# Patient Record
Sex: Male | Born: 1965 | Race: Black or African American | Hispanic: No | Smoking: Never smoker
Health system: Southern US, Community
[De-identification: ages and names within clinical notes are randomized; demographics above are authoritative.]

## PROBLEM LIST (undated history)

## (undated) DIAGNOSIS — I639 Cerebral infarction, unspecified: Secondary | ICD-10-CM

## (undated) DIAGNOSIS — Z8679 Personal history of other diseases of the circulatory system: Secondary | ICD-10-CM

## (undated) DIAGNOSIS — E785 Hyperlipidemia, unspecified: Secondary | ICD-10-CM

## (undated) HISTORY — DX: Cerebral infarction, unspecified: I63.9

---

## 2017-09-25 ENCOUNTER — Inpatient Hospital Stay (HOSPITAL_COMMUNITY): Payer: Self-pay

## 2017-09-25 ENCOUNTER — Emergency Department (HOSPITAL_COMMUNITY): Payer: Commercial Managed Care - PPO

## 2017-09-25 ENCOUNTER — Emergency Department (HOSPITAL_COMMUNITY): Payer: Commercial Managed Care - PPO | Admitting: Critical Care Medicine

## 2017-09-25 ENCOUNTER — Encounter (HOSPITAL_COMMUNITY)
Admission: EM | Disposition: A | Discharge status: Home / Self Care | Payer: Self-pay | Source: Home / Self Care | Attending: Neurology

## 2017-09-25 ENCOUNTER — Inpatient Hospital Stay (HOSPITAL_COMMUNITY)
Admission: EM | Admit: 2017-09-25 | Discharge: 2017-09-27 | DRG: 023 | Disposition: A | Discharge status: Home / Self Care | Payer: Commercial Managed Care - PPO | Attending: Neurology | Admitting: Neurology

## 2017-09-25 ENCOUNTER — Inpatient Hospital Stay (HOSPITAL_COMMUNITY): Payer: Commercial Managed Care - PPO

## 2017-09-25 DIAGNOSIS — R402252 Coma scale, best verbal response, oriented, at arrival to emergency department: Secondary | ICD-10-CM | POA: Diagnosis present

## 2017-09-25 DIAGNOSIS — Z888 Allergy status to other drugs, medicaments and biological substances status: Secondary | ICD-10-CM | POA: Diagnosis not present

## 2017-09-25 DIAGNOSIS — R402362 Coma scale, best motor response, obeys commands, at arrival to emergency department: Secondary | ICD-10-CM | POA: Diagnosis present

## 2017-09-25 DIAGNOSIS — R29720 NIHSS score 20: Secondary | ICD-10-CM | POA: Diagnosis present

## 2017-09-25 DIAGNOSIS — Z23 Encounter for immunization: Secondary | ICD-10-CM | POA: Diagnosis not present

## 2017-09-25 DIAGNOSIS — Z881 Allergy status to other antibiotic agents status: Secondary | ICD-10-CM

## 2017-09-25 DIAGNOSIS — S098XXA Other specified injuries of head, initial encounter: Secondary | ICD-10-CM | POA: Diagnosis present

## 2017-09-25 DIAGNOSIS — I63131 Cerebral infarction due to embolism of right carotid artery: Secondary | ICD-10-CM | POA: Diagnosis not present

## 2017-09-25 DIAGNOSIS — I63421 Cerebral infarction due to embolism of right anterior cerebral artery: Secondary | ICD-10-CM | POA: Diagnosis present

## 2017-09-25 DIAGNOSIS — I63311 Cerebral infarction due to thrombosis of right middle cerebral artery: Secondary | ICD-10-CM | POA: Diagnosis not present

## 2017-09-25 DIAGNOSIS — I5030 Unspecified diastolic (congestive) heart failure: Secondary | ICD-10-CM | POA: Diagnosis not present

## 2017-09-25 DIAGNOSIS — E785 Hyperlipidemia, unspecified: Secondary | ICD-10-CM | POA: Diagnosis present

## 2017-09-25 DIAGNOSIS — R471 Dysarthria and anarthria: Secondary | ICD-10-CM | POA: Diagnosis present

## 2017-09-25 DIAGNOSIS — I639 Cerebral infarction, unspecified: Secondary | ICD-10-CM

## 2017-09-25 DIAGNOSIS — H518 Other specified disorders of binocular movement: Secondary | ICD-10-CM | POA: Diagnosis present

## 2017-09-25 DIAGNOSIS — H5347 Heteronymous bilateral field defects: Secondary | ICD-10-CM | POA: Diagnosis present

## 2017-09-25 DIAGNOSIS — Z91041 Radiographic dye allergy status: Secondary | ICD-10-CM

## 2017-09-25 DIAGNOSIS — I63411 Cerebral infarction due to embolism of right middle cerebral artery: Secondary | ICD-10-CM | POA: Diagnosis present

## 2017-09-25 DIAGNOSIS — R402142 Coma scale, eyes open, spontaneous, at arrival to emergency department: Secondary | ICD-10-CM | POA: Diagnosis present

## 2017-09-25 DIAGNOSIS — R29810 Facial weakness: Secondary | ICD-10-CM | POA: Diagnosis present

## 2017-09-25 DIAGNOSIS — W228XXA Striking against or struck by other objects, initial encounter: Secondary | ICD-10-CM | POA: Diagnosis present

## 2017-09-25 DIAGNOSIS — I7771 Dissection of carotid artery: Secondary | ICD-10-CM | POA: Diagnosis present

## 2017-09-25 DIAGNOSIS — Y93H3 Activity, building and construction: Secondary | ICD-10-CM

## 2017-09-25 DIAGNOSIS — Y9289 Other specified places as the place of occurrence of the external cause: Secondary | ICD-10-CM

## 2017-09-25 DIAGNOSIS — Z8679 Personal history of other diseases of the circulatory system: Secondary | ICD-10-CM

## 2017-09-25 DIAGNOSIS — Y990 Civilian activity done for income or pay: Secondary | ICD-10-CM

## 2017-09-25 DIAGNOSIS — R531 Weakness: Secondary | ICD-10-CM | POA: Diagnosis present

## 2017-09-25 DIAGNOSIS — G8194 Hemiplegia, unspecified affecting left nondominant side: Secondary | ICD-10-CM | POA: Diagnosis present

## 2017-09-25 DIAGNOSIS — I6601 Occlusion and stenosis of right middle cerebral artery: Secondary | ICD-10-CM | POA: Diagnosis present

## 2017-09-25 DIAGNOSIS — I63511 Cerebral infarction due to unspecified occlusion or stenosis of right middle cerebral artery: Secondary | ICD-10-CM | POA: Diagnosis not present

## 2017-09-25 HISTORY — DX: Cerebral infarction, unspecified: I63.9

## 2017-09-25 HISTORY — PX: IR CT HEAD LTD: IMG2386

## 2017-09-25 HISTORY — DX: Personal history of other diseases of the circulatory system: Z86.79

## 2017-09-25 HISTORY — PX: RADIOLOGY WITH ANESTHESIA: SHX6223

## 2017-09-25 HISTORY — PX: IR PERCUTANEOUS ART THROMBECTOMY/INFUSION INTRACRANIAL INC DIAG ANGIO: IMG6087

## 2017-09-25 LAB — I-STAT CHEM 8, ED
BUN: 23 mg/dL — ABNORMAL HIGH (ref 6–20)
CREATININE: 1.3 mg/dL — AB (ref 0.61–1.24)
Calcium, Ion: 1.19 mmol/L (ref 1.15–1.40)
Chloride: 105 mmol/L (ref 98–111)
Glucose, Bld: 106 mg/dL — ABNORMAL HIGH (ref 70–99)
HEMATOCRIT: 44 % (ref 39.0–52.0)
HEMOGLOBIN: 15 g/dL (ref 13.0–17.0)
Potassium: 4 mmol/L (ref 3.5–5.1)
Sodium: 140 mmol/L (ref 135–145)
TCO2: 30 mmol/L (ref 22–32)

## 2017-09-25 LAB — RAPID URINE DRUG SCREEN, HOSP PERFORMED
AMPHETAMINES: NOT DETECTED
BENZODIAZEPINES: NOT DETECTED
Barbiturates: NOT DETECTED
COCAINE: NOT DETECTED
Opiates: NOT DETECTED
Tetrahydrocannabinol: NOT DETECTED

## 2017-09-25 LAB — CBC
HCT: 44.7 % (ref 39.0–52.0)
HEMOGLOBIN: 14 g/dL (ref 13.0–17.0)
MCH: 27.7 pg (ref 26.0–34.0)
MCHC: 31.3 g/dL (ref 30.0–36.0)
MCV: 88.5 fL (ref 78.0–100.0)
Platelets: 193 10*3/uL (ref 150–400)
RBC: 5.05 MIL/uL (ref 4.22–5.81)
RDW: 12.3 % (ref 11.5–15.5)
WBC: 4.6 10*3/uL (ref 4.0–10.5)

## 2017-09-25 LAB — CBG MONITORING, ED: Glucose-Capillary: 98 mg/dL (ref 70–99)

## 2017-09-25 LAB — URINALYSIS, ROUTINE W REFLEX MICROSCOPIC
BILIRUBIN URINE: NEGATIVE
GLUCOSE, UA: NEGATIVE mg/dL
HGB URINE DIPSTICK: NEGATIVE
Ketones, ur: NEGATIVE mg/dL
Leukocytes, UA: NEGATIVE
Nitrite: NEGATIVE
Protein, ur: NEGATIVE mg/dL
SPECIFIC GRAVITY, URINE: 1.012 (ref 1.005–1.030)
pH: 5 (ref 5.0–8.0)

## 2017-09-25 LAB — COMPREHENSIVE METABOLIC PANEL
ALT: 22 U/L (ref 0–44)
AST: 29 U/L (ref 15–41)
Albumin: 3.5 g/dL (ref 3.5–5.0)
Alkaline Phosphatase: 42 U/L (ref 38–126)
Anion gap: 9 (ref 5–15)
BUN: 16 mg/dL (ref 6–20)
CHLORIDE: 104 mmol/L (ref 98–111)
CO2: 27 mmol/L (ref 22–32)
CREATININE: 1.28 mg/dL — AB (ref 0.61–1.24)
Calcium: 9.3 mg/dL (ref 8.9–10.3)
GFR calc Af Amer: >60 mL/min (ref >60)
Glucose, Bld: 109 mg/dL — ABNORMAL HIGH (ref 70–99)
POTASSIUM: 4.4 mmol/L (ref 3.5–5.1)
SODIUM: 140 mmol/L (ref 135–145)
Total Bilirubin: 0.8 mg/dL (ref 0.3–1.2)
Total Protein: 6.5 g/dL (ref 6.5–8.1)

## 2017-09-25 LAB — DIFFERENTIAL
ABS IMMATURE GRANULOCYTES: 0 10*3/uL (ref 0.0–0.1)
BASOS PCT: 1 %
Basophils Absolute: 0.1 10*3/uL (ref 0.0–0.1)
EOS ABS: 0.6 10*3/uL (ref 0.0–0.7)
Eosinophils Relative: 13 %
IMMATURE GRANULOCYTES: 1 %
Lymphocytes Relative: 39 %
Lymphs Abs: 1.8 10*3/uL (ref 0.7–4.0)
MONOS PCT: 10 %
Monocytes Absolute: 0.5 10*3/uL (ref 0.1–1.0)
NEUTROS ABS: 1.6 10*3/uL — AB (ref 1.7–7.7)
Neutrophils Relative %: 36 %

## 2017-09-25 LAB — PROTIME-INR
INR: 0.97
Prothrombin Time: 12.8 seconds (ref 11.4–15.2)

## 2017-09-25 LAB — APTT: aPTT: 25 seconds (ref 24–36)

## 2017-09-25 LAB — I-STAT TROPONIN, ED: TROPONIN I, POC: 0.01 ng/mL (ref 0.00–0.08)

## 2017-09-25 LAB — ECHOCARDIOGRAM COMPLETE
Height: 69 in
Weight: 2289.26 oz

## 2017-09-25 LAB — ETHANOL: Alcohol, Ethyl (B): <10 mg/dL (ref <10)

## 2017-09-25 SURGERY — IR WITH ANESTHESIA
Anesthesia: Choice

## 2017-09-25 MED ORDER — SENNOSIDES-DOCUSATE SODIUM 8.6-50 MG PO TABS: 1.0000 | ORAL_TABLET | Freq: Every evening | ORAL | Status: DC | PRN

## 2017-09-25 MED ORDER — TIROFIBAN HCL IN NACL 5-0.9 MG/100ML-% IV SOLN
INTRAVENOUS | Status: AC
  Filled 2017-09-25: qty 100

## 2017-09-25 MED ORDER — ACETAMINOPHEN 160 MG/5ML PO SOLN: 650.0000 mg | ORAL | Status: DC | PRN

## 2017-09-25 MED ORDER — ROCURONIUM BROMIDE 50 MG/5ML IV SOSY
PREFILLED_SYRINGE | INTRAVENOUS | Status: DC | PRN
  Administered 2017-09-25: 10 mg via INTRAVENOUS
  Administered 2017-09-25: 40 mg via INTRAVENOUS

## 2017-09-25 MED ORDER — SODIUM CHLORIDE 0.9 % IV SOLN: 50.0000 mL | Freq: Once | INTRAVENOUS | Status: DC

## 2017-09-25 MED ORDER — CEFAZOLIN SODIUM-DEXTROSE 2-4 GM/100ML-% IV SOLN
INTRAVENOUS | Status: AC
  Filled 2017-09-25: qty 100

## 2017-09-25 MED ORDER — ACETAMINOPHEN 650 MG RE SUPP: 650.0000 mg | RECTAL | Status: DC | PRN

## 2017-09-25 MED ORDER — LIDOCAINE HCL 1 % IJ SOLN
INTRAMUSCULAR | Status: AC
  Filled 2017-09-25: qty 20

## 2017-09-25 MED ORDER — SODIUM CHLORIDE 0.9 % IV SOLN
INTRAVENOUS | Status: DC | PRN
  Administered 2017-09-25: 10 ug/min via INTRAVENOUS

## 2017-09-25 MED ORDER — ACETAMINOPHEN 325 MG PO TABS
650.0000 mg | ORAL_TABLET | ORAL | Status: DC | PRN
  Administered 2017-09-26 – 2017-09-27 (×4): 650 mg via ORAL
  Filled 2017-09-25 (×4): qty 2

## 2017-09-25 MED ORDER — CLEVIDIPINE BUTYRATE 0.5 MG/ML IV EMUL
0.0000 mg/h | INTRAVENOUS | Status: DC
  Filled 2017-09-25: qty 50

## 2017-09-25 MED ORDER — EPTIFIBATIDE 20 MG/10ML IV SOLN
INTRAVENOUS | Status: AC
  Filled 2017-09-25: qty 10

## 2017-09-25 MED ORDER — ALTEPLASE (STROKE) FULL DOSE INFUSION
0.9000 mg/kg | Freq: Once | INTRAVENOUS | Status: AC
  Administered 2017-09-25: 57.2 mg via INTRAVENOUS
  Filled 2017-09-25: qty 100

## 2017-09-25 MED ORDER — FAMOTIDINE IN NACL 20-0.9 MG/50ML-% IV SOLN
20.0000 mg | Freq: Two times a day (BID) | INTRAVENOUS | Status: DC
  Administered 2017-09-25 – 2017-09-26 (×2): 20 mg via INTRAVENOUS
  Filled 2017-09-25 (×2): qty 50

## 2017-09-25 MED ORDER — LIDOCAINE HCL (CARDIAC) PF 100 MG/5ML IV SOSY: PREFILLED_SYRINGE | INTRAVENOUS | Status: DC | PRN

## 2017-09-25 MED ORDER — LACTATED RINGERS IV SOLN
INTRAVENOUS | Status: DC | PRN
  Administered 2017-09-25 (×2): via INTRAVENOUS

## 2017-09-25 MED ORDER — ORAL CARE MOUTH RINSE
15.0000 mL | Freq: Two times a day (BID) | OROMUCOSAL | Status: DC
  Administered 2017-09-25 – 2017-09-26 (×2): 15 mL via OROMUCOSAL

## 2017-09-25 MED ORDER — LABETALOL HCL 5 MG/ML IV SOLN
20.0000 mg | Freq: Once | INTRAVENOUS | Status: DC
  Filled 2017-09-25: qty 4

## 2017-09-25 MED ORDER — TICAGRELOR 90 MG PO TABS
ORAL_TABLET | ORAL | Status: AC
  Filled 2017-09-25: qty 2

## 2017-09-25 MED ORDER — ACETAMINOPHEN 325 MG PO TABS: 650.0000 mg | ORAL_TABLET | ORAL | Status: DC | PRN

## 2017-09-25 MED ORDER — ASPIRIN 325 MG PO TABS
ORAL_TABLET | ORAL | Status: AC
  Filled 2017-09-25: qty 1

## 2017-09-25 MED ORDER — INFLUENZA VAC SPLIT QUAD 0.5 ML IM SUSY
0.5000 mL | PREFILLED_SYRINGE | INTRAMUSCULAR | Status: AC
  Administered 2017-09-26: 0.5 mL via INTRAMUSCULAR
  Filled 2017-09-25: qty 0.5

## 2017-09-25 MED ORDER — SODIUM CHLORIDE 0.9 % IV SOLN
INTRAVENOUS | Status: DC
  Administered 2017-09-25 – 2017-09-26 (×2): via INTRAVENOUS

## 2017-09-25 MED ORDER — CEFAZOLIN SODIUM-DEXTROSE 2-3 GM-%(50ML) IV SOLR
INTRAVENOUS | Status: DC | PRN
  Administered 2017-09-25: 2 g via INTRAVENOUS

## 2017-09-25 MED ORDER — SODIUM CHLORIDE 0.9 % IV SOLN: INTRAVENOUS | Status: DC

## 2017-09-25 MED ORDER — NITROGLYCERIN 1 MG/10 ML FOR IR/CATH LAB
INTRA_ARTERIAL | Status: DC | PRN
  Administered 2017-09-25: 25 ug via INTRA_ARTERIAL
  Administered 2017-09-25: 25 ug
  Administered 2017-09-25 (×4): 25 ug via INTRA_ARTERIAL

## 2017-09-25 MED ORDER — ONDANSETRON HCL 4 MG/2ML IJ SOLN
INTRAMUSCULAR | Status: DC | PRN
  Administered 2017-09-25: 4 mg via INTRAVENOUS

## 2017-09-25 MED ORDER — STROKE: EARLY STAGES OF RECOVERY BOOK
Freq: Once | Status: DC
  Filled 2017-09-25: qty 1

## 2017-09-25 MED ORDER — LIDOCAINE 2% (20 MG/ML) 5 ML SYRINGE
INTRAMUSCULAR | Status: DC | PRN
  Administered 2017-09-25: 60 mg via INTRAVENOUS

## 2017-09-25 MED ORDER — CLEVIDIPINE BUTYRATE 0.5 MG/ML IV EMUL: 0.0000 mg/h | INTRAVENOUS | Status: DC

## 2017-09-25 MED ORDER — IOPAMIDOL (ISOVUE-370) INJECTION 76%
INTRAVENOUS | Status: AC
  Administered 2017-09-25: 50 mL via INTRAVENOUS
  Filled 2017-09-25: qty 100

## 2017-09-25 MED ORDER — CLOPIDOGREL BISULFATE 300 MG PO TABS
ORAL_TABLET | ORAL | Status: AC
  Filled 2017-09-25: qty 1

## 2017-09-25 MED ORDER — NITROGLYCERIN 1 MG/10 ML FOR IR/CATH LAB
INTRA_ARTERIAL | Status: AC
  Filled 2017-09-25: qty 10

## 2017-09-25 MED ORDER — PROPOFOL 10 MG/ML IV BOLUS
INTRAVENOUS | Status: DC | PRN
  Administered 2017-09-25: 110 mg via INTRAVENOUS
  Administered 2017-09-25: 20 mg via INTRAVENOUS

## 2017-09-25 MED ORDER — SUCCINYLCHOLINE CHLORIDE 200 MG/10ML IV SOSY
PREFILLED_SYRINGE | INTRAVENOUS | Status: DC | PRN
  Administered 2017-09-25: 120 mg via INTRAVENOUS

## 2017-09-25 MED ORDER — IOHEXOL 300 MG/ML  SOLN: 156.0000 mL | Freq: Once | INTRAMUSCULAR | Status: DC | PRN

## 2017-09-25 NOTE — Progress Notes (Signed)
Pharmacist Code Stroke Response  Notified to mix tPA at 0830 by Dr. Wilford Corner Delivered tPA to RN at [phone]  Issues/delays encountered (if applicable): N/A  Lott Seelbach, Drake Leach 09/25/17 8:35 AM

## 2017-09-25 NOTE — ED Triage Notes (Signed)
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 Speech was slurred but oriented.

## 2017-09-25 NOTE — Progress Notes (Signed)
Patient ID: Frank Callahan, male   DOB: 1965-05-15, 52 y.o.   MRN: [id number] INR . 52 Y RH M LSW 730 am .mRSS 0. Acute onset of RT gaze deviation and  Lt sided weakness. CT Brain  NO ICH RT MCA hyperdense sign..ASPECTS 9. CTA  RT ICA/MCA/ACA occlusion. Emergent consent obtained  With Dr Wilford Corner for endovascular revascularization as no family or next of kin available physically or by phone.. S.Twania Bujak MD

## 2017-09-25 NOTE — H&P (Addendum)
NEURO HOSPITALIST H&P     Requesting Physician: Dr. Saul Fordyce    Chief Complaint: right gaze and left side weakness  History obtained from:  Patient   Chart  Patient and Chart     HPI:                                                                                                                                         Frank Callahan  (Frank Callahan DOB 1965-02-23) is a 52  Year old AA male brought in as a Frank Callahan with unknown PMH. Presented to [facility] ED as a code stroke for sudden right side gaze and left side weakness.    Patient woke up in his usual state of health and went to work. 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 EMS was called and a code stroke was activated. 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 TPA decision was made at 0833 and TPA started at 0835. IR was called at [phone]. Denies any anticoagulation, recent surgeries, GI bleeds. Two physician consent was performed d/t patient drowsy status and no family information.   ED course:  Creatinine: 1.30 BG: 106  No prior history of stroke noted.  Date last known well: Date: 09/25/2017 Time last known well: Time: 07:30   tPA Given: Yes; Started @ 0835 wt 63.6 kg Modified Rankin: Rankin Score=0 NIHSS:20  No past medical history on file. Denies any recent surgeries Denies being on blood thinners  No family history on file.      Social History:  has no tobacco, alcohol, and drug history on file.  Allergies: Allergies not on file  Medications:                                                                                                                      No medication list on file.  Denies being on any blood thinners.   ROS:  unobtainable from  patient due to mental status  General Examination:                                                                                                      Weight 63.6 kg.  HEENT-  Normocephalic, no lesions, without obvious abnormality.  Normal external eye and conjunctiva.  Cardiovascular- pulses palpable throughout   Lungs-no rhonchi or wheezing noted, no excessive working breathing.  Saturations within normal limits on RA Abdomen- All 4 quadrants palpated and nontender Extremities- Warm, dry and intact Musculoskeletal-no joint tenderness, deformity or swelling Skin-warm and dry, no hyperpigmentation, vitiligo, or suspicious lesions  Neurological Examination Mental Status: Drowsy but oriented, thought content appropriate.  Speech fluent without evidence of aphasia.  Able to follow 3 step commands without difficulty. Cranial Nerves:  right gaze preference, able to come to midline but unable to cross midline pupils equal, round, reactive to light  smile asymmetric, left facial droop.  Visual neglect on the left, left homonymous hemianopsia  Motor: Right : Upper extremity   4/5    Left:     Upper extremity 0/5  Lower extremity   4/5     Lower extremity  1/5 Tone and bulk:normal tone throughout; no atrophy noted Sensory: neglecting left side Deep Tendon Reflexes: 2+ and symmetric biceps and patella Plantars: Right: downgoing   Left: downgoing Cerebellar: UTA Gait: deferred   Lab Results: Basic Metabolic Panel: Recent Labs  Lab 09/25/17 0828  NA 140  K 4.0  CL 105  GLUCOSE 106*  BUN 23*  CREATININE 1.30*    CBC: Recent Labs  Lab 09/25/17 0825 09/25/17 0828  WBC 4.6  --   NEUTROABS 1.6*  --   HGB 14.0 15.0  HCT 44.7 44.0  MCV 88.5  --   PLT 193  --    CBG: Recent Labs  Lab 09/25/17 0821  GLUCAP 98    Valentina Lucks, MSN, NP-C [facility] [phone]  09/25/2017, 8:35 AM   Attending physician note to follow with Assessment and plan  .  Attending addendum Patient seen and examined as acute code stroke. Agree with the history and physical documented above. I have independently reviewed imaging, examined the patient.  Assessment and plan are below. CT-right MCA dense.  Aspects 9 CTA head and neck- right supraclinoid ICA and MCA occlusion.  Assessment:  Frank Callahan is a 52  Year old AA male with unknown PMH. Presented with rightward gaze and left-sided hemiplegia with left-sided neglect. Symptoms compatible with right MCA syndrome. Noncontrast CT of the head reviewed by me personally shows a dense right MCA along with an aspect score of 9. CTA head and neck was obtained that showed emergent large vessel occlusion of the supraclinoid ICA and MCA on the right. TPA was initiated at 8:35 AM, prior to obtaining CTA. As soon as the CTA was made available and reviewed, Dr. Corliss Skains from interventional radiology was called for a possible thrombectomy. He was taken to the angios suite right after the CTA was completed.  He is currently on the IR table undergoing  diagnostic cerebral angiogram and possible thrombectomy.   Impression: Acute ischemic stroke involving the right ICA and right MCA-likely embolic-cardio embolic versus atheroembolic   Recommendations and Plan: Acuity: Acute Current Suspected Etiology: Under investigation-likely cardioembolic versus atheroembolic Continue Evaluation:  -Admit to: NICU - 4N -Hold Aspirin until 24 hour post tPA neuroimaging is stable and without evidence of bleeding -Blood pressure control-post TPA goal but if he gets mechanical thrombectomy, then blood pressure goal would be less than 140/90.  If thrombectomy is unsuccessful, blood pressure goal less than 180/100 -MRI/ECHO/A1C/Lipid panel. -Hyperglycemia management per SSI to maintain glucose 140-180mg /dL. -PT/OT/ST therapies and recommendations when able -Obtain urinary toxicology screen  CNS -Cerebral edema-close neuro  monitoring -Dysarthria-n.p.o. until cleared by speech, speech therapy, advance diet as tolerated -Hemiparesis/hemiplegia affecting the left side- PT OT and PMR consult   RESP No acute issues. Monitor clinically   CV  -Blood pressure goals as above -Use Cleviprex and labetalol for achieving goals as above as needed -Transthoracic echo - Statin for LDL goal of less than 70  HEME -No active issues - Check labs and transfuse if hemoglobin is less than 7   ENDO No active issues -goal HgbA1c < 7  GI/GU Mild elevated creatinine.  GFR 73 calculated. -Gentle hydration -avoid nephrotoxic agents  Fluid/Electrolyte Disorders -Repeat labs -Replete as needed  ID Possible Aspiration PNA -CXR -NPO  Prophylaxis DVT: scd   GI: na Bowel: doc senna   Diet: NPO until cleared by speech  Code Status: Full Code   THE FOLLOWING WERE PRESENT ON ADMISSION: Acute ischemic stroke Hemiplegia Cerebral edema Probable aspiration pneumonia measures  Timing benchmarks: -Code stroke paged out with an ET of 30 minutes with the patient arrived much sooner than that. -Seen in the emergency rooms CT scanner at 8:22 AM. -Decision to TPA made at 8:30 AM -TPA bolus at 8:35 AM - IR called at 8:32 AM and discussed the case while pending CTA.  CRITICAL CARE ATTESTATION This patient is critically ill and at significant risk of neurological worsening, death and care requires constant monitoring of vital signs, hemodynamics, respiratory, and cardiac monitoring. I spent 60  minutes of neurocritical care time performing neurological assessment, discussion with family, other specialists and medical decision making of high complexity in the care of  this patient.  -- Milon Dikes, MD [facility] Pager: [phone] If 7pm to 7am, please call on call as listed on AMION.

## 2017-09-25 NOTE — Progress Notes (Signed)
Re: carotid duplex.  Patient had CTA neck 9/11. Please advise if carotid still needed.   [phone] vascular lab

## 2017-09-25 NOTE — Code Documentation (Signed)
52yo male arriving to [facility] at [phone]. 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 He was noticed to slump over and coworkers called EMS. EMS assessed left sided paralysis and right gaze and activated a code stroke. Stroke team to the bedside on patient arrival. Patient to CT with team. CT completed showing dense right MCA. NIHSS 20, see documentation for details and code stroke times. Patient somnolent with left facial droop, left hemianopsia, right gaze unable to cross midline, left hemiplegia, left sensory loss and neglect and mild dysarthria on exam. Dr. Wilford Corner to the bedside. Decision made to treat with tPA. BP assessed and within tPA parameters. Foley catheter placed. 5.7mg  tPA bolus given over 1 minute followed by 51.5mg /hr for a total of 57.2mg  per pharmacy dosing. CTA completed. Patient to IR with team for endovascular intervention. Bedside handoff with IR RN and CRNA.

## 2017-09-25 NOTE — ED Notes (Signed)
Pt CBG was 98 nurse notified

## 2017-09-25 NOTE — Procedures (Signed)
S/P RT common carotid arteriogram followed by complete revascularization of occluded RT MCA ,RT ACA RT ICA supraclinoid  seg with x 3 passes with the 42mm x 33 mm embotrap retriver device ,and x 1 pass with the 3mm x 20 mm trevo provue retriever device achieving a TICI 3 reperfusion.

## 2017-09-25 NOTE — Transfer of Care (Signed)
Immediate Anesthesia Transfer of Care Note  Patient: Frank Callahan  Procedure(s) Performed: IR WITH ANESTHESIA (N/A )  Patient Location: PACU  Anesthesia Type:General  Level of Consciousness: awake and alert   Airway & Oxygen Therapy: Patient Spontanous Breathing and Patient connected to nasal cannula oxygen  Post-op Assessment: Report given to RN, Post -op Vital signs reviewed and stable and Patient able to stick tongue midline  Post vital signs: Reviewed and stable  Last Vitals:  Vitals Value Taken Time  BP 118/82 09/25/2017 11:53 AM  Temp    Pulse 88 09/25/2017 11:55 AM  Resp 14 09/25/2017 11:55 AM  SpO2 99 % 09/25/2017 11:55 AM  Vitals shown include unvalidated device data.  Last Pain: There were no vitals filed for this visit.       Complications: No apparent anesthesia complications

## 2017-09-25 NOTE — Progress Notes (Signed)
SLP Cancellation Note  Patient Details Name: Frank Callahan MRN: [id number] DOB: 23-Dec-1965   Cancelled treatment:       Reason Eval/Treat Not Completed: Patient at procedure or test/unavailable due to revascularization. SLP to f/u next date.   [name] Laurice 09/25/2017, 3:31 PM

## 2017-09-25 NOTE — Anesthesia Preprocedure Evaluation (Signed)
Anesthesia Evaluation  Preop documentation limited or incomplete due to emergent nature of procedure.  Airway Mallampati: III  TM Distance: >3 FB Neck ROM: Full    Dental no notable dental hx.    Pulmonary    Pulmonary exam normal        Cardiovascular Normal cardiovascular exam     Neuro/Psych CVA    GI/Hepatic   Endo/Other    Renal/GU      Musculoskeletal   Abdominal   Peds  Hematology   Anesthesia Other Findings code stroke  Reproductive/Obstetrics                             Anesthesia Physical Anesthesia Plan  ASA: III and emergent  Anesthesia Plan: General   Post-op Pain Management:    Induction: Intravenous  PONV Risk Score and Plan: 2 and Treatment may vary due to age or medical condition  Airway Management Planned: Oral ETT  Additional Equipment: Arterial line  Intra-op Plan:   Post-operative Plan: Extubation in OR and Possible Post-op intubation/ventilation  Informed Consent: I have reviewed the patients History and Physical, chart, labs and discussed the procedure including the risks, benefits and alternatives for the proposed anesthesia with the patient or authorized representative who has indicated his/her understanding and acceptance.     Plan Discussed with: CRNA  Anesthesia Plan Comments:         Anesthesia Quick Evaluation

## 2017-09-25 NOTE — Sedation Documentation (Signed)
Bedside report given to [facility], PACU.. Right groin checked with Aneta Mins. Dressing clean, dry and intact. Also informed Aneta Mins that patients sister is on the way from out of town. Phone number given to [place], Charity fundraiser.

## 2017-09-25 NOTE — ED Notes (Signed)
Unable to get EKG pt went to IR

## 2017-09-25 NOTE — Sedation Documentation (Signed)
Bed Rest to start at 1115.

## 2017-09-25 NOTE — Sedation Documentation (Signed)
Report given to Asher Muir, RN, ICU. Informed Asher Muir that patient will go to PACU prior to coming to room.

## 2017-09-25 NOTE — Anesthesia Procedure Notes (Signed)
Procedure Name: Intubation Date/Time: 09/25/2017 8:54 AM Performed by: Wilburn Cornelia, CRNA Pre-anesthesia Checklist: Patient identified, Emergency Drugs available, Patient being monitored, Suction available and Timeout performed Patient Re-evaluated:Patient Re-evaluated prior to induction Oxygen Delivery Method: Circle system utilized Preoxygenation: Pre-oxygenation with 100% oxygen Induction Type: IV induction, Rapid sequence and Cricoid Pressure applied Laryngoscope Size: Mac and 4 Grade View: Grade I Tube type: Oral Tube size: 7.5 mm Number of attempts: 1 Airway Equipment and Method: Stylet Placement Confirmation: ETT inserted through vocal cords under direct vision,  positive ETCO2,  CO2 detector and breath sounds checked- equal and bilateral Secured at: 22 cm Tube secured with: Tape Dental Injury: Teeth and Oropharynx as per pre-operative assessment

## 2017-09-25 NOTE — ED Notes (Signed)
Vitals at 10:27 was done at 8:23 not 10:27.

## 2017-09-25 NOTE — Progress Notes (Signed)
Patient ID: Frank Callahan, male   DOB: 1965/06/20, 52 y.o.   MRN: [id number] INR.  Patient extubated . Maintaining )2 sat. Gradually waking up. Obeys simple commands appropriately .  Moves RT A and l to command and spontaneously. No sig movement on the left. Pupils 97mm RT = Lt sluggishly reactive. RT groin soft .Distal pulses palpable DPs and PTs bilaterally, except RT DP which is  dopplerable.Marland Kitchen   S.Deveshwatr MD

## 2017-09-25 NOTE — Progress Notes (Signed)
  Echocardiogram 2D Echocardiogram has been performed.  Frank Callahan T Frank Callahan 09/25/2017, 2:42 PM

## 2017-09-25 NOTE — ED Provider Notes (Addendum)
[facility] EMERGENCY DEPARTMENT Provider Note   CSN: [id number] Arrival date & time: 09/25/17  8250     History   Chief Complaint No chief complaint on file.   HPI Frank Callahan is a 52 y.o. male.  Patient brought in as a code stroke.  Patient last seen normal at 730 this morning.  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  Patient was showing evidence of left-sided weakness and a right-sided gaze.  Blood pressures systolic were in the 120s 140 range.  There was some question of the right sided headache but it was not clear patient denied that upon arrival here.  Patient also seemed a little somnolent.  Patient's blood sugar was 104 as per EMS.  Patient taken immediately to the CT scanner.  No apparent fall or injury.     No past medical history on file.  There are no active problems to display for this patient.        Home Medications    Prior to Admission medications   Not on File    Family History No family history on file.  Social History Social History   Tobacco Use  . Smoking status: Not on file  Substance Use Topics  . Alcohol use: Not on file  . Drug use: Not on file     Allergies   Patient has no allergy information on record.   Review of Systems Review of Systems  Unable to perform ROS: Acuity of condition     Physical Exam Updated Vital Signs Wt 63.6 kg   Physical Exam  Constitutional: He appears well-developed and well-nourished. No distress.  HENT:  Head: Normocephalic and atraumatic.  Mouth/Throat: Oropharynx is clear and moist.  Eyes: Conjunctivae are normal.  Prominent rightward gaze.  Eyes will not cross the midline.  Seems to be ignoring the left side.  Neck: Neck supple.  Cardiovascular: Normal rate, regular rhythm and normal heart sounds.  Pulmonary/Chest:  Effort normal and breath sounds normal. No respiratory distress.  Abdominal: Soft. Bowel sounds are normal. There is no tenderness.  Musculoskeletal: Normal range of motion. He exhibits no edema.  Neurological:  Drowsy almost complete left upper extremity weakness.  Near complete left lower extremity weakness.  Right word gaze is of ignoring the left side.  Patient able to speak and give date of birth since answer some questions.  Skin: Skin is warm.  Nursing note and vitals reviewed.    ED Treatments / Results  Labs (all labs ordered are listed, but only abnormal results are displayed) Labs Reviewed  ETHANOL  PROTIME-INR  APTT  CBC  DIFFERENTIAL  COMPREHENSIVE METABOLIC PANEL  RAPID URINE DRUG SCREEN, HOSP PERFORMED  URINALYSIS, ROUTINE W REFLEX MICROSCOPIC  CBG MONITORING, ED  I-STAT CHEM 8, ED  I-STAT TROPONIN, ED    EKG None  Radiology No results found.  Procedures Procedures (including critical care time)  CRITICAL CARE Performed by: Vanetta Mulders Total critical care time: 30 minutes Critical care time was exclusive of separately billable procedures and treating other patients. Critical care was necessary to treat or prevent imminent or life-threatening deterioration. Critical care was time spent personally by me on the following activities: development of treatment plan with patient and/or surrogate as well as nursing, discussions with consultants, evaluation of patient's response to treatment, examination of patient, obtaining  history from patient or surrogate, ordering and performing treatments and interventions, ordering and review of laboratory studies, ordering and review of radiographic studies, pulse oximetry and re-evaluation of patient's condition.  09/25/2017  Medications Ordered in ED Medications  iopamidol (ISOVUE-370) 76 % injection (has no administration in time range)  alteplase (ACTIVASE) 1 mg/mL infusion 57.2 mg (has no administration in time  range)    Followed by  0.9 %  sodium chloride infusion (has no administration in time range)     Initial Impression / Assessment and Plan / ED Course  I have reviewed the triage vital signs and the nursing notes.  Pertinent labs & imaging results that were available during my care of the patient were reviewed by me and considered in my medical decision making (see chart for details).     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  Patient apparently from the [place] area.  Patient last seen normal at 730.  EMS noted that blood pressures had been normal and that his blood sugars have been fine.  Blood sugar for them was 104.  Patient seemed drowsy patient had a right-sided gaze.  Kind of ignored the left side.  Had significant weakness to the right upper extremity and almost complete right lower extremity.  NIH score either 10 or 12.   Head CT showed evidence of right MCA thrombosis.  In consultation with neuro hospitalist Dr. Jerrell Belfast patient will go for thrombectomy.  Patient receiving TPA.  Dr. demential are contacted.  CTA head and neck was ordered results still pending.     Final Clinical Impressions(s) / ED Diagnoses   Final diagnoses:  Cerebrovascular accident (CVA) due to thrombosis of right middle cerebral artery [facility])    ED Discharge Orders    None       Vanetta Mulders, MD 09/25/17 [phone]  Addendum:  CTA confirmed right middle cerebral artery clot.  Does seem to be some extension down into the carotid area as well.    Vanetta Mulders, MD 09/25/17 [phone]

## 2017-09-26 ENCOUNTER — Inpatient Hospital Stay (HOSPITAL_COMMUNITY): Payer: Commercial Managed Care - PPO

## 2017-09-26 ENCOUNTER — Other Ambulatory Visit: Payer: Self-pay

## 2017-09-26 ENCOUNTER — Encounter (HOSPITAL_COMMUNITY): Payer: Self-pay

## 2017-09-26 DIAGNOSIS — I6601 Occlusion and stenosis of right middle cerebral artery: Secondary | ICD-10-CM

## 2017-09-26 DIAGNOSIS — I63131 Cerebral infarction due to embolism of right carotid artery: Secondary | ICD-10-CM

## 2017-09-26 DIAGNOSIS — I7771 Dissection of carotid artery: Secondary | ICD-10-CM

## 2017-09-26 DIAGNOSIS — I63311 Cerebral infarction due to thrombosis of right middle cerebral artery: Secondary | ICD-10-CM

## 2017-09-26 DIAGNOSIS — I639 Cerebral infarction, unspecified: Secondary | ICD-10-CM

## 2017-09-26 DIAGNOSIS — E785 Hyperlipidemia, unspecified: Secondary | ICD-10-CM

## 2017-09-26 LAB — CBC WITH DIFFERENTIAL/PLATELET
ABS IMMATURE GRANULOCYTES: 0 10*3/uL (ref 0.0–0.1)
Basophils Absolute: 0 10*3/uL (ref 0.0–0.1)
Basophils Relative: 0 %
Eosinophils Absolute: 0 10*3/uL (ref 0.0–0.7)
Eosinophils Relative: 1 %
HEMATOCRIT: 37.6 % — AB (ref 39.0–52.0)
HEMOGLOBIN: 12 g/dL — AB (ref 13.0–17.0)
Immature Granulocytes: 0 %
LYMPHS ABS: 1.1 10*3/uL (ref 0.7–4.0)
Lymphocytes Relative: 15 %
MCH: 28.4 pg (ref 26.0–34.0)
MCHC: 31.9 g/dL (ref 30.0–36.0)
MCV: 89.1 fL (ref 78.0–100.0)
Monocytes Absolute: 0.7 10*3/uL (ref 0.1–1.0)
Monocytes Relative: 10 %
Neutro Abs: 5.6 10*3/uL (ref 1.7–7.7)
Neutrophils Relative %: 74 %
Platelets: 160 10*3/uL (ref 150–400)
RBC: 4.22 MIL/uL (ref 4.22–5.81)
RDW: 12.6 % (ref 11.5–15.5)
WBC: 7.5 10*3/uL (ref 4.0–10.5)

## 2017-09-26 LAB — LIPID PANEL
Cholesterol: 187 mg/dL (ref 0–200)
HDL: 67 mg/dL (ref >40)
LDL CALC: 107 mg/dL — AB (ref 0–99)
Total CHOL/HDL Ratio: 2.8 RATIO
Triglycerides: 67 mg/dL (ref <150)
VLDL: 13 mg/dL (ref 0–40)

## 2017-09-26 LAB — HEMOGLOBIN A1C
HEMOGLOBIN A1C: 6.1 % — AB (ref 4.8–5.6)
Mean Plasma Glucose: 128.37 mg/dL

## 2017-09-26 LAB — BASIC METABOLIC PANEL
Anion gap: 6 (ref 5–15)
BUN: 8 mg/dL (ref 6–20)
CALCIUM: 8.3 mg/dL — AB (ref 8.9–10.3)
CO2: 25 mmol/L (ref 22–32)
Chloride: 110 mmol/L (ref 98–111)
Creatinine, Ser: 1.23 mg/dL (ref 0.61–1.24)
GFR calc non Af Amer: >60 mL/min (ref >60)
Glucose, Bld: 111 mg/dL — ABNORMAL HIGH (ref 70–99)
POTASSIUM: 3.7 mmol/L (ref 3.5–5.1)
Sodium: 141 mmol/L (ref 135–145)

## 2017-09-26 LAB — MRSA PCR SCREENING: MRSA by PCR: NEGATIVE

## 2017-09-26 LAB — HIV ANTIBODY (ROUTINE TESTING W REFLEX): HIV SCREEN 4TH GENERATION: NONREACTIVE

## 2017-09-26 MED ORDER — ASPIRIN EC 325 MG PO TBEC
325.0000 mg | DELAYED_RELEASE_TABLET | Freq: Every day | ORAL | Status: DC
  Administered 2017-09-26 – 2017-09-27 (×2): 325 mg via ORAL
  Filled 2017-09-26 (×2): qty 1

## 2017-09-26 MED ORDER — PANTOPRAZOLE SODIUM 40 MG PO TBEC
40.0000 mg | DELAYED_RELEASE_TABLET | Freq: Every day | ORAL | Status: DC
  Administered 2017-09-26 – 2017-09-27 (×2): 40 mg via ORAL
  Filled 2017-09-26 (×2): qty 1

## 2017-09-26 MED ORDER — CLOPIDOGREL BISULFATE 75 MG PO TABS
75.0000 mg | ORAL_TABLET | Freq: Every day | ORAL | Status: DC
  Administered 2017-09-26 – 2017-09-27 (×2): 75 mg via ORAL
  Filled 2017-09-26 (×2): qty 1

## 2017-09-26 MED ORDER — ATORVASTATIN CALCIUM 10 MG PO TABS
20.0000 mg | ORAL_TABLET | Freq: Every day | ORAL | Status: DC
  Administered 2017-09-26 – 2017-09-27 (×2): 20 mg via ORAL
  Filled 2017-09-26 (×2): qty 2

## 2017-09-26 NOTE — Anesthesia Postprocedure Evaluation (Signed)
Anesthesia Post Note  Patient: Frank ReichmannRicky Novack  Procedure(s) Performed: IR WITH ANESTHESIA (N/A )     Patient location during evaluation: PACU Anesthesia Type: General Level of consciousness: awake Pain management: pain level controlled Vital Signs Assessment: post-procedure vital signs reviewed and stable Respiratory status: spontaneous breathing, nonlabored ventilation, respiratory function stable and patient connected to nasal cannula oxygen Cardiovascular status: blood pressure returned to baseline and stable Postop Assessment: no apparent nausea or vomiting Anesthetic complications: no    Last Vitals:  Vitals:   09/26/17 0900 09/26/17 1000  BP: 116/81 125/87  Pulse: 67 64  Resp: 15 13  Temp:    SpO2: 100% 100%    Last Pain:  Vitals:   09/26/17 0800  TempSrc:   PainSc: 6                  Ryan P Ellender

## 2017-09-26 NOTE — Evaluation (Signed)
Clinical/Bedside Swallow Evaluation Patient Details  Name: Barbette ReichmannRicky Shingledecker MRN: [id number] Date of Birth: 10/30/1965  Today's Date: 09/26/2017 Time: SLP Start Time (ACUTE ONLY): 0950 SLP Stop Time (ACUTE ONLY): 1000 SLP Time Calculation (min) (ACUTE ONLY): 10 min  Past Medical History: History reviewed. No pertinent past medical history. Past Surgical History HPI:  Mr. Barbette ReichmannRicky Resh is a 52 y.o. male with no PMH of  admitted for acute left sided weakness and right gaze. TPA given. Underwent IR with TICI3 reperfusion.   Assessment / Plan / Recommendation Clinical Impression  Pt demonstrates normal swallow function. No signs of aspiration or dysphagia observed. Recommend pt initiate regular diet and thin liquids, will sign off for swallowing but f/u for cognitive assessment.  SLP Visit Diagnosis: Dysphagia, unspecified (R13.10)    Aspiration Risk  Mild aspiration risk    Diet Recommendation Regular;Thin liquid   Liquid Administration via: Cup;Straw Medication Administration: Whole meds with liquid Supervision: Patient able to self feed Postural Changes: Seated upright at 90 degrees    Other  Recommendations     Follow up Recommendations None      Frequency and Duration            Prognosis        Swallow Study   General HPI: Mr. Barbette ReichmannRicky Terrero is a 52 y.o. male with no PMH of  admitted for acute left sided weakness and right gaze. TPA given. Underwent IR with TICI3 reperfusion. Type of Study: Bedside Swallow Evaluation Diet Prior to this Study: NPO Temperature Spikes Noted: No Respiratory Status: Room air History of Recent Intubation: No Behavior/Cognition: Alert;Cooperative;Pleasant mood Oral Cavity Assessment: Within Functional Limits Oral Care Completed by SLP: No Oral Cavity - Dentition: Adequate natural dentition Vision: Functional for self-feeding Self-Feeding Abilities: Able to feed self Patient Positioning: Upright in bed Baseline Vocal Quality:  Normal Volitional Cough: Strong Volitional Swallow: Able to elicit    Oral/Motor/Sensory Function Overall Oral Motor/Sensory Function: Mild impairment Facial Symmetry: Within Functional Limits Facial Strength: Reduced right Facial Sensation: Within Functional Limits Lingual ROM: Within Functional Limits Lingual Symmetry: Within Functional Limits Lingual Strength: Within Functional Limits Lingual Sensation: Within Functional Limits Velum: Within Functional Limits Mandible: Within Functional Limits   Ice Chips     Thin Liquid Thin Liquid: Within functional limits    Nectar Thick Nectar Thick Liquid: Not tested   Honey Thick Honey Thick Liquid: Not tested   Puree Puree: Within functional limits   Solid     Solid: Within functional limits      Tashawna Thom, Riley NearingBonnie Caroline 09/26/2017,12:15 PM

## 2017-09-26 NOTE — Progress Notes (Signed)
STROKE TEAM PROGRESS NOTE   SUBJECTIVE (INTERVAL HISTORY) His mom is at the bedside.  Overall he feels his condition is rapidly improving. Currently he is AAO x3, mild left facial droop and left UE weakness, but LLE strong. He endorsed that 15-68min before his symptoms onset, he hit his head heard on the machine he was working with. I suspect it caused the right ICA dissection. Will need to do CTA neck to evaluate the ICA thrombus status post IR.    OBJECTIVE Temp:  [97.2 F (36.2 C)-98.6 F (37 C)] 98.2 F (36.8 C) (09/12 0400) Pulse Rate:  [64-93] 64 (09/12 1000) Cardiac Rhythm: Normal sinus rhythm (09/12 0800) Resp:  [10-19] 13 (09/12 1000) BP: (112-137)/(75-89) 125/87 (09/12 1000) SpO2:  [96 %-100 %] 100 % (09/12 1000) Weight:  [64.9 kg] 64.9 kg (09/11 1330)  Recent Labs  Lab 09/25/17 0821  GLUCAP 98   Recent Labs  Lab 09/25/17 0825 09/25/17 0828 09/26/17 0527  NA 140 140 141  K 4.4 4.0 3.7  CL 104 105 110  CO2 27  --  25  GLUCOSE 109* 106* 111*  BUN 16 23* 8  CREATININE 1.28* 1.30* 1.23  CALCIUM 9.3  --  8.3*   Recent Labs  Lab 09/25/17 0825  AST 29  ALT 22  ALKPHOS 42  BILITOT 0.8  PROT 6.5  ALBUMIN 3.5   Recent Labs  Lab 09/25/17 0825 09/25/17 0828 09/26/17 0527  WBC 4.6  --  7.5  NEUTROABS 1.6*  --  5.6  HGB 14.0 15.0 12.0*  HCT 44.7 44.0 37.6*  MCV 88.5  --  89.1  PLT 193  --  160   No results for input(s): CKTOTAL, CKMB, CKMBINDEX, TROPONINI in the last 168 hours. Recent Labs    09/25/17 0825  LABPROT 12.8  INR 0.97   Recent Labs    09/25/17 1817  COLORURINE COLORLESS*  LABSPEC 1.012  PHURINE 5.0  GLUCOSEU NEGATIVE  HGBUR NEGATIVE  BILIRUBINUR NEGATIVE  KETONESUR NEGATIVE  PROTEINUR NEGATIVE  NITRITE NEGATIVE  LEUKOCYTESUR NEGATIVE       Component Value Date/Time   CHOL 187 09/26/2017 0527   TRIG 67 09/26/2017 0527   HDL 67 09/26/2017 0527   CHOLHDL 2.8 09/26/2017 0527   VLDL 13 09/26/2017 0527   LDLCALC 107 (H)  09/26/2017 0527   Lab Results  Component Value Date   HGBA1C 6.1 (H) 09/26/2017      Component Value Date/Time   LABOPIA NONE DETECTED 09/25/2017 1817   COCAINSCRNUR NONE DETECTED 09/25/2017 1817   LABBENZ NONE DETECTED 09/25/2017 1817   AMPHETMU NONE DETECTED 09/25/2017 1817   THCU NONE DETECTED 09/25/2017 1817   LABBARB NONE DETECTED 09/25/2017 1817    Recent Labs  Lab 09/25/17 1453  ETH <10    I have personally reviewed the radiological images below and agree with the radiology interpretations.  Ct Angio Head W Or Wo Contrast  Result Date: 09/25/2017 CLINICAL DATA:  Code stroke.  Right-sided gaze. EXAM: CT ANGIOGRAPHY HEAD AND NECK TECHNIQUE: Multidetector CT imaging of the head and neck was performed using the standard protocol during bolus administration of intravenous contrast. Multiplanar CT image reconstructions and MIPs were obtained to evaluate the vascular anatomy. Carotid stenosis measurements (when applicable) are obtained utilizing NASCET criteria, using the distal internal carotid diameter as the denominator. CONTRAST:  50 cc Isovue 370 COMPARISON:  Head CT earlier same day FINDINGS: CTA NECK FINDINGS Aortic arch: Normal Right carotid system: Common carotid artery widely patent to  the bifurcation. Carotid bifurcation shows soft plaque/thrombus in the ICA bulb. Minimal diameter is 3.5 mm. Compared to a more distal cervical ICA diameter of 5 mm, this indicates a 30% stenosis. Left carotid system: Common carotid artery widely patent to the bifurcation. Carotid bifurcation is normal. Cervical ICA is normal. Vertebral arteries: Both vertebral arteries are widely patent at their origins and through the cervical region to the foramen magnum. Skeleton: Negative Other neck: Negative Upper chest: Negative Review of the MIP images confirms the above findings CTA HEAD FINDINGS Anterior circulation: Right ICA is small because of inflow reduction. There is embolic occlusion of the  supraclinoid ICA and proximal MCA on the right. Some collateral reconstitution of more distal MCA branches. Left ACA and MCA are normal an widely patent. Posterior circulation: Both vertebral arteries widely patent to the basilar. Basilar fenestration of no significance. Posterior circulation branch vessels are normal. No patent posterior communicating artery visible on the right. Venous sinuses: Patent and normal. Anatomic variants: None significant. Delayed phase: No abnormal enhancement. 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 Soft plaque/thrombus in the ICA bulb on the right with stenosis of 30%. No other vascular pathology. The patient does not demonstrate widespread atherosclerotic disease. These results were communicated to Dr. Wilford Corner At 8:58 amon 9/11/2019by text page via the [facility] messaging system. Electronically Signed   By: Paulina Fusi M.D.   On: 09/25/2017 09:00   Ct Angio Neck W And/or Wo Contrast  Result Date: 09/25/2017 CLINICAL DATA:  Code stroke.  Right-sided gaze. EXAM: CT ANGIOGRAPHY HEAD AND NECK TECHNIQUE: Multidetector CT imaging of the head and neck was performed using the standard protocol during bolus administration of intravenous contrast. Multiplanar CT image reconstructions and MIPs were obtained to evaluate the vascular anatomy. Carotid stenosis measurements (when applicable) are obtained utilizing NASCET criteria, using the distal internal carotid diameter as the denominator. CONTRAST:  50 cc Isovue 370 COMPARISON:  Head CT earlier same day FINDINGS: CTA NECK FINDINGS Aortic arch: Normal Right carotid system: Common carotid artery widely patent to the bifurcation. Carotid bifurcation shows soft plaque/thrombus in the ICA bulb. Minimal diameter is 3.5 mm. Compared to a more distal cervical ICA diameter of 5 mm, this indicates a 30% stenosis. Left carotid system: Common  carotid artery widely patent to the bifurcation. Carotid bifurcation is normal. Cervical ICA is normal. Vertebral arteries: Both vertebral arteries are widely patent at their origins and through the cervical region to the foramen magnum. Skeleton: Negative Other neck: Negative Upper chest: Negative Review of the MIP images confirms the above findings CTA HEAD FINDINGS Anterior circulation: Right ICA is small because of inflow reduction. There is embolic occlusion of the supraclinoid ICA and proximal MCA on the right. Some collateral reconstitution of more distal MCA branches. Left ACA and MCA are normal an widely patent. Posterior circulation: Both vertebral arteries widely patent to the basilar. Basilar fenestration of no significance. Posterior circulation branch vessels are normal. No patent posterior communicating artery visible on the right. Venous sinuses: Patent and normal. Anatomic variants: None significant. Delayed phase: No abnormal enhancement. 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 Soft plaque/thrombus in the ICA bulb on the right with stenosis of 30%. No other vascular pathology. The patient does not demonstrate widespread atherosclerotic disease. These results were communicated to Dr. Wilford Corner  At 8:58 amon 9/11/2019by text page via the [facility] messaging system. Electronically Signed   By: Paulina FusiMark  Shogry M.D.   On: 09/25/2017 09:00   Mr Brain Wo Contrast  Result Date: 09/26/2017 CLINICAL DATA:  52 y/o M; right supraclinoid ICA and MCA occlusion post intra-arterial intervention. EXAM: MRI HEAD WITHOUT CONTRAST MRA HEAD WITHOUT CONTRAST TECHNIQUE: Multiplanar, multiecho pulse sequences of the brain and surrounding structures were obtained without intravenous contrast. Angiographic images of the head were obtained using MRA technique without contrast. COMPARISON:  09/25/2017 CT head, CTA head,  angiogram head. FINDINGS: MRI HEAD FINDINGS Brain: Reduced diffusion with T2 FLAIR hyperintensity and mild local mass effect involving the right caudate nucleus, right lentiform nucleus, right insular cortex, and right frontal operculum compatible with early subacute infarction. Additional small foci of infarction are present in the right superolateral frontal lobe, right parietal lobe, and right posterior temporal lobe. Punctate foci of susceptibility hypointensity are present within the right lentiform nucleus compatible with petechial hemorrhage. Mass effect results in partial effacement of the right lateral ventricle and 2 mm of right-to-left midline shift. No extra-axial collection, hydrocephalus, effacement of basilar cisterns, or herniation. Vascular: As below. Skull and upper cervical spine: Normal marrow signal. Sinuses/Orbits: Mild diffuse paranasal sinus mucosal thickening and partial opacification of the mastoid air cells. Other: None. MRA HEAD FINDINGS Internal carotid arteries:  Patent. Anterior cerebral arteries:  Patent. Middle cerebral arteries: Patent bilaterally. Mild lumen irregularity proximal stenosis of right M1 segment. Anterior communicating artery: Patent. Posterior communicating arteries:  Patent. Posterior cerebral arteries:  Patent. Basilar artery:  Patent.  Lower basilar fenestration. Vertebral arteries:  Patent. No evidence of high-grade stenosis, large vessel occlusion, or aneurysm. IMPRESSION: 1. Early subacute infarction involving right basal ganglia, right insula, and right frontal operculum. Additional small scattered infarcts in the right frontal, parietal, and temporal lobes. Petechial hemorrhage within the right caudate nucleus. 2. Patent anterior and posterior intracranial circulation. No new vessel occlusion/reocclusion. Mild lumen irregularity and mild proximal stenosis of the right M1 segment. These results will be called to the ordering clinician or representative by the  Radiologist Assistant, and communication documented in the PACS or zVision Dashboard. Electronically Signed   By: Mitzi HansenLance  Furusawa-Stratton M.D.   On: 09/26/2017 06:47   Mr Maxine GlennMra Head Wo Contrast  Result Date: 09/26/2017 CLINICAL DATA:  52 y/o M; right supraclinoid ICA and MCA occlusion post intra-arterial intervention. EXAM: MRI HEAD WITHOUT CONTRAST MRA HEAD WITHOUT CONTRAST TECHNIQUE: Multiplanar, multiecho pulse sequences of the brain and surrounding structures were obtained without intravenous contrast. Angiographic images of the head were obtained using MRA technique without contrast. COMPARISON:  09/25/2017 CT head, CTA head, angiogram head. FINDINGS: MRI HEAD FINDINGS Brain: Reduced diffusion with T2 FLAIR hyperintensity and mild local mass effect involving the right caudate nucleus, right lentiform nucleus, right insular cortex, and right frontal operculum compatible with early subacute infarction. Additional small foci of infarction are present in the right superolateral frontal lobe, right parietal lobe, and right posterior temporal lobe. Punctate foci of susceptibility hypointensity are present within the right lentiform nucleus compatible with petechial hemorrhage. Mass effect results in partial effacement of the right lateral ventricle and 2 mm of right-to-left midline shift. No extra-axial collection, hydrocephalus, effacement of basilar cisterns, or herniation. Vascular: As below. Skull and upper cervical spine: Normal marrow signal. Sinuses/Orbits: Mild diffuse paranasal sinus mucosal thickening and partial opacification of the mastoid air cells. Other: None. MRA HEAD FINDINGS Internal carotid arteries:  Patent. Anterior cerebral arteries:  Patent. Middle  cerebral arteries: Patent bilaterally. Mild lumen irregularity proximal stenosis of right M1 segment. Anterior communicating artery: Patent. Posterior communicating arteries:  Patent. Posterior cerebral arteries:  Patent. Basilar artery:  Patent.   Lower basilar fenestration. Vertebral arteries:  Patent. No evidence of high-grade stenosis, large vessel occlusion, or aneurysm. IMPRESSION: 1. Early subacute infarction involving right basal ganglia, right insula, and right frontal operculum. Additional small scattered infarcts in the right frontal, parietal, and temporal lobes. Petechial hemorrhage within the right caudate nucleus. 2. Patent anterior and posterior intracranial circulation. No new vessel occlusion/reocclusion. Mild lumen irregularity and mild proximal stenosis of the right M1 segment. These results will be called to the ordering clinician or representative by the Radiologist Assistant, and communication documented in the PACS or zVision Dashboard. Electronically Signed   By: Mitzi Hansen M.D.   On: 09/26/2017 06:47   Ct Head Code Stroke Wo Contrast  Result Date: 09/25/2017 CLINICAL DATA:  Code stroke. Unexplained altered level of consciousness. EXAM: CT HEAD WITHOUT CONTRAST TECHNIQUE: Contiguous axial images were obtained from the base of the skull through the vertex without intravenous contrast. COMPARISON:  None. FINDINGS: Brain: Question early loss of gray-white differentiation in the insular region on the right. Otherwise, the brain parenchyma appears normal. No mass, hemorrhage, hydrocephalus or extra-axial collection. Vascular: Question hyperdense right MCA. Skull: Negative Sinuses/Orbits: Mild mucosal thickening.  Orbits negative. Other: None ASPECTS ([place] Stroke Program Early CT Score) - Ganglionic level infarction (caudate, lentiform nuclei, internal capsule, insula, M1-M3 cortex): 7 - Supraganglionic infarction (M4-M6 cortex): 3 Total score (0-10 with 10 being normal): 10 IMPRESSION: 1. Possible early loss of gray-white differentiation in the insular region on the right. Question hyperdense right MCA 2. ASPECTS is 9. 3. These results were communicated to Wilford Corner at 8:45 amon 9/11/2019by text page via the [facility] messaging  system. Electronically Signed   By: Paulina Fusi M.D.   On: 09/25/2017 08:47   CTA neck pening  TTE  - Normal LV size with mild LV hypertrophy. EF 60-65%. Normal RV   size and systolic function. No significant valvular   abnormalities.  LE venous doppler pending    PHYSICAL EXAM  Temp:  [97.2 F (36.2 C)-98.6 F (37 C)] 98.2 F (36.8 C) (09/12 0400) Pulse Rate:  [64-93] 64 (09/12 1000) Resp:  [10-19] 13 (09/12 1000) BP: (112-137)/(75-89) 125/87 (09/12 1000) SpO2:  [96 %-100 %] 100 % (09/12 1000) Weight:  [64.9 kg] 64.9 kg (09/11 1330)  General - Well nourished, well developed, in no apparent distress.  Ophthalmologic - fundi not visualized due to noncooperation.  Cardiovascular - Regular rate and rhythm.  Mental Status -  Level of arousal and orientation to time, place, and person were intact. Language including expression, naming, repetition, comprehension was assessed and found intact. Fund of Knowledge was assessed and was intact.  Cranial Nerves II - XII - II - Visual field intact OU. III, IV, VI - Extraocular movements intact. V - Facial sensation intact bilaterally. VII - left facial droop mild. VIII - Hearing & vestibular intact bilaterally. X - Palate elevates symmetrically. XI - Chin turning & shoulder shrug intact bilaterally. XII - Tongue protrusion intact.  Motor Strength - The patient's strength was normal in all extremities except LUE 4+/5 proximal and distal and pronator drift was absent.  Bulk was normal and fasciculations were absent.   Motor Tone - Muscle tone was assessed at the neck and appendages and was normal.  Reflexes - The patient's reflexes were symmetrical in all extremities  and he had no pathological reflexes.  Sensory - Light touch, temperature/pinprick were assessed and were symmetrical.    Coordination - The patient had normal movements in the hands with no ataxia or dysmetria.  Tremor was absent.  Gait and Station -  deferred.   ASSESSMENT/PLAN Mr. Clark Cuff is a 52 y.o. male with no PMH of  admitted for acute left sided weakness and right gaze. TPA given. Underwent IR with TICI3 reperfusion.  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  Resultant left UE mild weakness, left mild facial droop  CT no acute abnormality  CTA h/n right ICA thrombus, right ICA terminus and MCA occlusion  DSA right tICA and MCA occlusion s/p TICI3 reperfusion  MRI  Right MCA multifocal and punctate ACA infarcts  MRA  Right MCA patent with mild irregularity  CTA neck repeat pending  2D Echo EF 60-65%  LE venous doppler pending  LDL 107  HgbA1c 6.1  UDS neg  SCDs for VTE prophylaxis  On diet  No antithrombotic prior to admission, now on aspirin 325 mg daily and clopidogrel 75 mg daily for possible right ICA dissection.   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 This patient's care requires constant monitoring of vital signs, hemodynamics, respiratory and cardiac monitoring, review of multiple  databases, neurological assessment, discussion with family, other specialists and medical decision making of high complexity. I spent 45 minutes of neurocritical care time in the care of this patient. I had long discussion with mom and pt at bedside, updated pt current condition, treatment plan and potential prognosis. They expressed understanding and appreciation.    Marvel Plan, MD PhD Stroke Neurology 09/26/2017 10:39 AM    To contact Stroke Continuity provider, please refer to [email or web address]. After hours, contact General Neurology

## 2017-09-26 NOTE — Progress Notes (Signed)
Referring Physician(s): Code Stroke  Supervising Physician: Julieanne Cotton  Patient Status:  [facility] - In-pt  Chief Complaint:  Code stroke S/P RT common carotid arteriogram followed by complete revascularization of occluded RT MCA ,RT ACA RT ICA supraclinoid  seg with x 3 passes with the 5mm x 33 mm embotrap retriver device ,and x 1 pass with the 3mm x 20 mm trevo provue retriever device achieving a TICI 3 reperfusion.  Subjective:  Patient is very sleep this morning. He follows some commands but doesn't want to open his eyes for me. Family member (? Mother) at bedside.  Allergies: Iodinated diagnostic agents; Neurontin [gabapentin]; Amoxicillin; and Clarithromycin  Medications: Prior to Admission medications   Medication Sig Start Date End Date Taking? Authorizing Provider  acetaminophen (TYLENOL) 500 MG tablet Take 500 mg by mouth every 6 (six) hours as needed for moderate pain.   Yes [provider]     Vital Signs: BP (!) 127/95   Pulse 71   Temp 98.2 F (36.8 C) (Oral)   Resp 16   Ht 5\' 9"  (1.753 m)   Wt 64.9 kg   SpO2 100%   BMI 21.13 kg/m   Physical Exam Awake but won't open his eyes NAD Able to raise both arms. 4/5 grip strength on the left, normal on the right. Right CFA cath site ok, no bleeding, no hematoma, no pseudoaneurysm  Per Dr. Warren Danes physical exam = II - Visual field intact OU. III, IV, VI - Extraocular movements intact. V - Facial sensation intact bilaterally. VII - left facial droop mild. VIII - Hearing & vestibular intact bilaterally. X - Palate elevates symmetrically. XI - Chin turning & shoulder shrug intact bilaterally. XII - Tongue protrusion intact.   Imaging: Ct Angio Head W Or Wo Contrast  Result Date: 09/25/2017 CLINICAL DATA:  Code stroke.  Right-sided gaze. EXAM: CT ANGIOGRAPHY HEAD AND NECK TECHNIQUE: Multidetector CT imaging of the head and neck was performed using the standard protocol during bolus  administration of intravenous contrast. Multiplanar CT image reconstructions and MIPs were obtained to evaluate the vascular anatomy. Carotid stenosis measurements (when applicable) are obtained utilizing NASCET criteria, using the distal internal carotid diameter as the denominator. CONTRAST:  50 cc Isovue 370 COMPARISON:  Head CT earlier same day FINDINGS: CTA NECK FINDINGS Aortic arch: Normal Right carotid system: Common carotid artery widely patent to the bifurcation. Carotid bifurcation shows soft plaque/thrombus in the ICA bulb. Minimal diameter is 3.5 mm. Compared to a more distal cervical ICA diameter of 5 mm, this indicates a 30% stenosis. Left carotid system: Common carotid artery widely patent to the bifurcation. Carotid bifurcation is normal. Cervical ICA is normal. Vertebral arteries: Both vertebral arteries are widely patent at their origins and through the cervical region to the foramen magnum. Skeleton: Negative Other neck: Negative Upper chest: Negative Review of the MIP images confirms the above findings CTA HEAD FINDINGS Anterior circulation: Right ICA is small because of inflow reduction. There is embolic occlusion of the supraclinoid ICA and proximal MCA on the right. Some collateral reconstitution of more distal MCA branches. Left ACA and MCA are normal an widely patent. Posterior circulation: Both vertebral arteries widely patent to the basilar. Basilar fenestration of no significance. Posterior circulation branch vessels are normal. No patent posterior communicating artery visible on the right. Venous sinuses: Patent and normal. Anatomic variants: None significant. Delayed phase: No abnormal enhancement. 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 Soft plaque/thrombus in the ICA bulb on the right with stenosis of 30%. No other vascular pathology. The patient does not demonstrate  widespread atherosclerotic disease. These results were communicated to Dr. Wilford Corner At 8:58 amon 9/11/2019by text page via the [facility] messaging system. Electronically Signed   By: Paulina Fusi M.D.   On: 09/25/2017 09:00   Ct Head Wo Contrast  Result Date: 09/26/2017 CLINICAL DATA:  Stroke.  Post clot retrieval 09/25/2017 EXAM: CT HEAD WITHOUT CONTRAST TECHNIQUE: Contiguous axial images were obtained from the base of the skull through the vertex without intravenous contrast. COMPARISON:  MRI head 09/26/2017 FINDINGS: Brain: Acute infarct in the right head of caudate, right putamen and anterior limb internal capsule is noted on MRI. Acute infarct in the right anterior insula with small patchy areas of hypodensity in the right frontal lobe also unchanged from the MRI. Small area of acute infarct in the right parietal lobe unchanged from MRI. Negative for hemorrhage. Ventricle size normal. Slight midline shift to the left due to edema in the right basal ganglia. Vascular: Negative for hyperdense vessel Skull: Negative Sinuses/Orbits: Mild mucosal edema paranasal sinuses.  Normal orbit Other: None IMPRESSION: Acute infarct in the right MCA territory primarily involving the right basal ganglia but also the right frontal and right parietal lobe similar distribution MRI earlier today. No acute hemorrhage. Electronically Signed   By: Marlan Palau M.D.   On: 09/26/2017 10:43   Ct Angio Neck W And/or Wo Contrast  Result Date: 09/25/2017 CLINICAL DATA:  Code stroke.  Right-sided gaze. EXAM: CT ANGIOGRAPHY HEAD AND NECK TECHNIQUE: Multidetector CT imaging of the head and neck was performed using the standard protocol during bolus administration of intravenous contrast. Multiplanar CT image reconstructions and MIPs were obtained to evaluate the vascular anatomy. Carotid stenosis measurements (when applicable) are obtained utilizing NASCET criteria, using the distal internal carotid diameter as the denominator. CONTRAST:   50 cc Isovue 370 COMPARISON:  Head CT earlier same day FINDINGS: CTA NECK FINDINGS Aortic arch: Normal Right carotid system: Common carotid artery widely patent to the bifurcation. Carotid bifurcation shows soft plaque/thrombus in the ICA bulb. Minimal diameter is 3.5 mm. Compared to a more distal cervical ICA diameter of 5 mm, this indicates a 30% stenosis. Left carotid system: Common carotid artery widely patent to the bifurcation. Carotid bifurcation is normal. Cervical ICA is normal. Vertebral arteries: Both vertebral arteries are widely patent at their origins and through the cervical region to the foramen magnum. Skeleton: Negative Other neck: Negative Upper chest: Negative Review of the MIP images confirms the above findings CTA HEAD FINDINGS Anterior circulation: Right ICA is small because of inflow reduction. There is embolic occlusion of the supraclinoid ICA and proximal MCA on the right. Some collateral reconstitution of more distal MCA branches. Left ACA and MCA are normal an widely patent. Posterior circulation: Both vertebral arteries widely patent to the basilar. Basilar fenestration of no significance. Posterior circulation branch vessels are normal. No patent posterior communicating artery visible on the right. Venous sinuses: Patent and normal. Anatomic variants: None significant. Delayed phase: No abnormal enhancement. 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 Soft plaque/thrombus in the ICA bulb on the right with stenosis of 30%. No other vascular pathology. The patient does not demonstrate widespread atherosclerotic disease. These results were communicated to Dr. Wilford Corner At 8:58 amon 9/11/2019by  text page via the [facility] messaging system. Electronically Signed   By: Paulina Fusi M.D.   On: 09/25/2017 09:00   Mr Brain Wo Contrast  Result Date: 09/26/2017 CLINICAL DATA:  52 y/o M; right  supraclinoid ICA and MCA occlusion post intra-arterial intervention. EXAM: MRI HEAD WITHOUT CONTRAST MRA HEAD WITHOUT CONTRAST TECHNIQUE: Multiplanar, multiecho pulse sequences of the brain and surrounding structures were obtained without intravenous contrast. Angiographic images of the head were obtained using MRA technique without contrast. COMPARISON:  09/25/2017 CT head, CTA head, angiogram head. FINDINGS: MRI HEAD FINDINGS Brain: Reduced diffusion with T2 FLAIR hyperintensity and mild local mass effect involving the right caudate nucleus, right lentiform nucleus, right insular cortex, and right frontal operculum compatible with early subacute infarction. Additional small foci of infarction are present in the right superolateral frontal lobe, right parietal lobe, and right posterior temporal lobe. Punctate foci of susceptibility hypointensity are present within the right lentiform nucleus compatible with petechial hemorrhage. Mass effect results in partial effacement of the right lateral ventricle and 2 mm of right-to-left midline shift. No extra-axial collection, hydrocephalus, effacement of basilar cisterns, or herniation. Vascular: As below. Skull and upper cervical spine: Normal marrow signal. Sinuses/Orbits: Mild diffuse paranasal sinus mucosal thickening and partial opacification of the mastoid air cells. Other: None. MRA HEAD FINDINGS Internal carotid arteries:  Patent. Anterior cerebral arteries:  Patent. Middle cerebral arteries: Patent bilaterally. Mild lumen irregularity proximal stenosis of right M1 segment. Anterior communicating artery: Patent. Posterior communicating arteries:  Patent. Posterior cerebral arteries:  Patent. Basilar artery:  Patent.  Lower basilar fenestration. Vertebral arteries:  Patent. No evidence of high-grade stenosis, large vessel occlusion, or aneurysm. IMPRESSION: 1. Early subacute infarction involving right basal ganglia, right insula, and right frontal operculum.  Additional small scattered infarcts in the right frontal, parietal, and temporal lobes. Petechial hemorrhage within the right caudate nucleus. 2. Patent anterior and posterior intracranial circulation. No new vessel occlusion/reocclusion. Mild lumen irregularity and mild proximal stenosis of the right M1 segment. These results will be called to the ordering clinician or representative by the Radiologist Assistant, and communication documented in the PACS or zVision Dashboard. Electronically Signed   By: Mitzi Hansen M.D.   On: 09/26/2017 06:47   Mr Maxine Glenn Head Wo Contrast  Result Date: 09/26/2017 CLINICAL DATA:  52 y/o M; right supraclinoid ICA and MCA occlusion post intra-arterial intervention. EXAM: MRI HEAD WITHOUT CONTRAST MRA HEAD WITHOUT CONTRAST TECHNIQUE: Multiplanar, multiecho pulse sequences of the brain and surrounding structures were obtained without intravenous contrast. Angiographic images of the head were obtained using MRA technique without contrast. COMPARISON:  09/25/2017 CT head, CTA head, angiogram head. FINDINGS: MRI HEAD FINDINGS Brain: Reduced diffusion with T2 FLAIR hyperintensity and mild local mass effect involving the right caudate nucleus, right lentiform nucleus, right insular cortex, and right frontal operculum compatible with early subacute infarction. Additional small foci of infarction are present in the right superolateral frontal lobe, right parietal lobe, and right posterior temporal lobe. Punctate foci of susceptibility hypointensity are present within the right lentiform nucleus compatible with petechial hemorrhage. Mass effect results in partial effacement of the right lateral ventricle and 2 mm of right-to-left midline shift. No extra-axial collection, hydrocephalus, effacement of basilar cisterns, or herniation. Vascular: As below. Skull and upper cervical spine: Normal marrow signal. Sinuses/Orbits: Mild diffuse paranasal sinus mucosal thickening and partial  opacification of the mastoid air cells. Other: None. MRA HEAD FINDINGS Internal carotid arteries:  Patent. Anterior cerebral arteries:  Patent. Middle cerebral arteries: Patent  bilaterally. Mild lumen irregularity proximal stenosis of right M1 segment. Anterior communicating artery: Patent. Posterior communicating arteries:  Patent. Posterior cerebral arteries:  Patent. Basilar artery:  Patent.  Lower basilar fenestration. Vertebral arteries:  Patent. No evidence of high-grade stenosis, large vessel occlusion, or aneurysm. IMPRESSION: 1. Early subacute infarction involving right basal ganglia, right insula, and right frontal operculum. Additional small scattered infarcts in the right frontal, parietal, and temporal lobes. Petechial hemorrhage within the right caudate nucleus. 2. Patent anterior and posterior intracranial circulation. No new vessel occlusion/reocclusion. Mild lumen irregularity and mild proximal stenosis of the right M1 segment. These results will be called to the ordering clinician or representative by the Radiologist Assistant, and communication documented in the PACS or zVision Dashboard. Electronically Signed   By: Mitzi HansenLance  Furusawa-Stratton M.D.   On: 09/26/2017 06:47   Ct Head Code Stroke Wo Contrast  Result Date: 09/25/2017 CLINICAL DATA:  Code stroke. Unexplained altered level of consciousness. EXAM: CT HEAD WITHOUT CONTRAST TECHNIQUE: Contiguous axial images were obtained from the base of the skull through the vertex without intravenous contrast. COMPARISON:  None. FINDINGS: Brain: Question early loss of gray-white differentiation in the insular region on the right. Otherwise, the brain parenchyma appears normal. No mass, hemorrhage, hydrocephalus or extra-axial collection. Vascular: Question hyperdense right MCA. Skull: Negative Sinuses/Orbits: Mild mucosal thickening.  Orbits negative. Other: None ASPECTS ([place] Stroke Program Early CT Score) - Ganglionic level infarction (caudate,  lentiform nuclei, internal capsule, insula, M1-M3 cortex): 7 - Supraganglionic infarction (M4-M6 cortex): 3 Total score (0-10 with 10 being normal): 10 IMPRESSION: 1. Possible early loss of gray-white differentiation in the insular region on the right. Question hyperdense right MCA 2. ASPECTS is 9. 3. These results were communicated to Wilford Cornerrora at 8:45 amon 9/11/2019by text page via the [facility] messaging system. Electronically Signed   By: Paulina FusiMark  Shogry M.D.   On: 09/25/2017 08:47    Labs:  CBC: Recent Labs    09/25/17 0825 09/25/17 0828 09/26/17 0527  WBC 4.6  --  7.5  HGB 14.0 15.0 12.0*  HCT 44.7 44.0 37.6*  PLT 193  --  160    COAGS: Recent Labs    09/25/17 0825  INR 0.97  APTT 25    BMP: Recent Labs    09/25/17 0825 09/25/17 0828 09/26/17 0527  NA 140 140 141  K 4.4 4.0 3.7  CL 104 105 110  CO2 27  --  25  GLUCOSE 109* 106* 111*  BUN 16 23* 8  CALCIUM 9.3  --  8.3*  CREATININE 1.28* 1.30* 1.23  GFRNONAA >60  --  >60  GFRAA >60  --  >60    LIVER FUNCTION TESTS: Recent Labs    09/25/17 0825  BILITOT 0.8  AST 29  ALT 22  ALKPHOS 42  PROT 6.5  ALBUMIN 3.5    Assessment and Plan:  Code stroke  Presented with acute left sided weaknss and right gaze.  TPA given  S/P RT common carotid arteriogram followed by complete revascularization of occluded RT MCA ,RT ACA RT ICA supraclinoid  seg with x 3 passes with the 5mm x 33 mm embotrap retriver device ,and x 1 pass with the 3mm x 20 mm trevo provue retriever device achieving a TICI 3 reperfusion.  Neuro exam has improved since procedure.  Dr. Roda ShuttersXu has ordered repeat CTA of the neck which is pending.  Electronically Signed: Gwynneth MacleodWENDY S Raiyah Speakman, PA-C 09/26/2017, 11:49 AM    I spent a total of 15 Minutes  at the the patient's bedside AND on the patient's hospital floor or unit, greater than 50% of which was counseling/coordinating care for carotid revascularization.

## 2017-09-26 NOTE — Progress Notes (Signed)
Preliminary notes--Bilateral lower extremities venous duplex exam completed. Negative for DVT. Hongying Richardson DoppCole (RDMS RVT) 09/26/17 12:37 PM

## 2017-09-26 NOTE — Evaluation (Signed)
Occupational Therapy Evaluation Patient Details Name: Frank Callahan MRN: [id number] DOB: 03-08-1965 Today's Date: 09/26/2017    History of Present Illness pt is a 52 y/o male with no pertinent medical hx, admitted to [facility] ED as a code stroke for sudden right gaze and left side weakness.  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 He demonstrates the below listed deficits and will benefit from continued OT to maximize safety and independence with BADLs.  Pt presents to OT with Mild Rt UE weakness and decreased coordination, mild higher level cognitive deficits, mild balance deficits, and decreased activity tolerance.  He requires min guard assist for ADLs and functional mobility.   PTA, he was fully independent including driving, working in [employer] which includes climbing on high surfaces, operating heavy machinery, etc.   Recommend follow OPOT, and will likey need driving assessment to ensure he is safe to return to work       Follow Up Recommendations  Outpatient OT;Supervision/Assistance - 24 hour    Equipment Recommendations  None recommended by OT    Recommendations for Other Services       Precautions / Restrictions Precautions Precautions: (mild fall risk)      Mobility Bed Mobility Overal bed mobility: Needs Assistance Bed Mobility: Supine to Sit     Supine to sit: Supervision        Transfers Overall transfer level: Needs assistance   Transfers: Sit to/from Stand Sit to Stand: Min guard              Balance Overall balance assessment: No apparent balance deficits (not formally assessed)                                         ADL either performed or assessed with clinical judgement   ADL Overall ADL's : Needs assistance/impaired Eating/Feeding:  Independent   Grooming: Wash/dry hands;Wash/dry face;Oral care;Brushing hair;Min guard;Standing   Upper Body Bathing: Set up;Sitting   Lower Body Bathing: Min guard;Sit to/from stand   Upper Body Dressing : Supervision/safety;Set up;Sitting   Lower Body Dressing: Min guard;Sit to/from stand Lower Body Dressing Details (indicate cue type and reason): struggled to don socks due to impaired coordination  Toilet Transfer: Min guard;Ambulation;Comfort height toilet   Toileting- Clothing Manipulation and Hygiene: Min guard;Sit to/from stand       Functional mobility during ADLs: Min guard       Vision Baseline Vision/History: No visual deficits Patient Visual Report: No change from baseline Vision Assessment?: Yes Eye Alignment: Within Functional Limits Ocular Range of Motion: Within Functional Limits Alignment/Gaze Preference: Within Defined Limits Tracking/Visual Pursuits: Able to track stimulus in all quads without difficulty Saccades: Within functional limits Visual Fields: No apparent deficits Additional Comments: Pt denies visual deficits.  Will benefit from further testing in function      Perception Perception Perception Tested?: Yes   Praxis Praxis Praxis tested?: Within functional limits    Pertinent Vitals/Pain Pain Assessment: Faces Faces Pain Scale: Hurts little more Pain Location: right HA, right neck and abdomen (chronic) Pain Descriptors / Indicators: Sharp;Sore Pain Intervention(s): Monitored during session     Hand Dominance Right(states uses both)   Extremity/Trunk Assessment Upper Extremity Assessment Upper Extremity Assessment: LUE deficits/detail LUE Deficits /  Details: Demonstrates movement in Brunnstrom stage V  LUE Coordination: decreased fine motor;decreased gross motor   Lower Extremity Assessment Lower Extremity Assessment: RLE deficits/detail;LLE deficits/detail RLE Deficits / Details: generally functional.  tingling/numbness behind his  knee.  grossly >4/5 LLE Deficits / Details: generally functional,  grossly 4/5 strength   Cervical / Trunk Assessment Cervical / Trunk Assessment: Normal   Communication Communication Communication: No difficulties   Cognition Arousal/Alertness: Awake/alert Behavior During Therapy: Flat affect Overall Cognitive Status: Impaired/Different from baseline Area of Impairment: Attention;Problem solving                   Current Attention Level: Alternating         Problem Solving: Slow processing General Comments: Pt lethargic on entrance and kept eyes closed for first half of eval engaging minimally.   He became more engaged and animated as he ambulated in hallway. he was able to perform serial 2's to and from 100 while ambulating without error, but was slow with responses.  He states he does not feel he is at baseline, and is not currently not functioning at a safe level to return to work which demonstrates good anticipatory awareness    General Comments  VSS.   Mother present     Exercises     Shoulder Instructions      Home Living Family/patient expects to be discharged to:: Private residence Living Arrangements: Parent(with parents) Available Help at Discharge: Available 24 hours/day(Dad is retired.) Type of Home: House Home Access: Stairs to enter Secretary/administrator of Steps: 5 Entrance Stairs-Rails: Right;Left Home Layout: One level               Home Equipment: None          Prior Functioning/Environment Level of Independence: Independent        Comments: work in [employer] and also uses heavy machinery        OT Problem List: Decreased strength;Decreased activity tolerance;Impaired balance (sitting and/or standing);Impaired vision/perception;Decreased coordination;Decreased cognition;Impaired UE functional use      OT Treatment/Interventions: Self-care/ADL training;DME and/or AE instruction;Therapeutic activities;Cognitive  remediation/compensation;Visual/perceptual remediation/compensation;Patient/family education;Balance training    OT Goals(Current goals can be found in the care plan section) Acute Rehab OT Goals Patient Stated Goal: "I want run"  OT Goal Formulation: With patient Time For Goal Achievement: 10/10/17 Potential to Achieve Goals: Good ADL Goals Pt Will Perform Grooming: with modified independence;standing Pt Will Perform Upper Body Bathing: with modified independence;sitting Pt Will Perform Lower Body Bathing: with modified independence;sit to/from stand Pt Will Perform Upper Body Dressing: with modified independence;sitting Pt Will Perform Lower Body Dressing: with modified independence;sit to/from stand Pt Will Transfer to Toilet: with modified independence;ambulating;regular height toilet;bedside commode;grab bars Pt Will Perform Toileting - Clothing Manipulation and hygiene: with modified independence;sit to/from stand Pt Will Perform Tub/Shower Transfer: Tub transfer;Shower transfer;with modified independence;ambulating Additional ADL Goal #1: Pt will be able to manipulate small objects in Rt hand without difficulty Additional ADL Goal #2: Pt will be able to perform moderately complex path finding without cues Additional ADL Goal #3: Pt will be ablet to alternate and divide attention while performing novel activities  OT Frequency: Min 2X/week   Barriers to D/C:            Co-evaluation PT/OT/SLP Co-Evaluation/Treatment: Yes Reason for Co-Treatment: For patient/therapist safety;To address functional/ADL transfers PT goals addressed during session: Mobility/safety with mobility OT goals addressed during session: ADL's and self-care      AM-PAC PT "6 Clicks" Daily  Activity     Outcome Measure Help from another person eating meals?: None Help from another person taking care of personal grooming?: A Little Help from another person toileting, which includes using toliet, bedpan, or  urinal?: A Little Help from another person bathing (including washing, rinsing, drying)?: A Little Help from another person to put on and taking off regular upper body clothing?: A Little Help from another person to put on and taking off regular lower body clothing?: A Little 6 Click Score: 19   End of Session Equipment Utilized During Treatment: Gait belt Nurse Communication: Mobility status  Activity Tolerance: Patient tolerated treatment well Patient left: in chair;with call bell/phone within reach;with family/visitor present  OT Visit Diagnosis: Unsteadiness on feet (R26.81);Cognitive communication deficit (R41.841);Hemiplegia and hemiparesis Symptoms and signs involving cognitive functions: Cerebral infarction Hemiplegia - Right/Left: Right Hemiplegia - dominant/non-dominant: Dominant Hemiplegia - caused by: Cerebral infarction                Time: 1241-1303 OT Time Calculation (min): 22 min Charges:  OT General Charges $OT Visit: 1 Visit OT Evaluation $OT Eval Moderate Complexity: 1 Mod  Jeani HawkingWendi Husayn Reim, OTR/L Acute Rehabilitation Services Pager [phone] Office [phone]   Jeani HawkingConarpe, Jakyah Bradby M 09/26/2017, 3:46 PM

## 2017-09-26 NOTE — Evaluation (Deleted)
Physical Therapy Evaluation Patient Details Name: Frank Callahan MRN: [id number] DOB: 1965-09-02 Today's Date: 09/26/2017   History of Present Illness  pt is a 52 y/o male with no pertinent medical hx, admitted to [facility] ED as a code stroke for sudden right gaze and left side weakness.  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  Pt .  Pt currently limited functionally due to the problems listed below.  (see problems list.)  Pt will benefit from PT to maximize function and safety to be able to get home safely with available assist .     Follow Up Recommendations Outpatient PT;Supervision/Assistance - 24 hour    Equipment Recommendations  None recommended by PT    Recommendations for Other Services       Precautions / Restrictions Precautions Precautions: (mild fall risk)      Mobility  Bed Mobility Overal bed mobility: Needs Assistance Bed Mobility: Supine to Sit     Supine to sit: Supervision        Transfers Overall transfer level: Needs assistance   Transfers: Sit to/from Stand Sit to Stand: Min guard            Ambulation/Gait Ambulation/Gait assistance: Supervision Gait Distance (Feet): 300 Feet   Gait Pattern/deviations: Step-through pattern Gait velocity: slower Gait velocity interpretation: 1.31 - 2.62 ft/sec, indicative of limited community ambulator General Gait Details: pt generally steady with gait.  Slower than age appropriated, but able to increase speed a bit to cuing.  Pt able to divide his attention and count up and down from 100 by 2's.  When he fatigued from the count, he could continue from where he left off without problem.  No deviation noted with abrupt change of direction or speed changes.  Stairs             Wheelchair Mobility    Modified Rankin (Stroke Patients Only)       Balance Overall balance assessment: No apparent balance deficits (not formally assessed)                                           Pertinent Vitals/Pain Pain Assessment: Faces Faces Pain Scale: Hurts little more Pain Location: right HA, right neck and abdomen (chronic) Pain Descriptors / Indicators: Sharp;Sore Pain Intervention(s): Monitored during session    Home Living Family/patient expects to be discharged to:: Private residence Living Arrangements: Parent(with parents) Available Help at Discharge: Available 24 hours/day(Dad is retired.) Type of Home: House Home Access: Stairs to enter Entrance Stairs-Rails: Doctor, general practice of Steps: 5 Home Layout: One level Home Equipment: None      Prior Function Level of Independence: Independent         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  tingling/numbness behind his knee.  grossly >4/5 LLE Deficits / Details: generally functional,  grossly 4/5 strength       Communication  Communication: No difficulties  Cognition Arousal/Alertness: Awake/alert Behavior During Therapy: WFL for tasks assessed/performed Overall Cognitive Status: Within Functional Limits for tasks assessed                                        General Comments General comments (skin integrity, edema, etc.): vss    Exercises     Assessment/Plan    PT Assessment Patient needs continued PT services  PT Problem List Decreased strength;Decreased mobility;Decreased balance;Pain        PT Treatment Interventions Gait training;Functional mobility training;Therapeutic activities;Balance training;Neuromuscular re-education;Patient/family education;Stair training    PT Goals (Current goals can be found in the Care Plan section)  Acute Rehab PT Goals Patient Stated Goal: running,   back to work PT Goal Formulation: With patient Time For Goal Achievement: 10/03/17 Potential to Achieve Goals: Good    Frequency Min 3X/week   Barriers to discharge        Co-evaluation               AM-PAC PT "6 Clicks" Daily Activity  Outcome Measure Difficulty turning over in bed (including adjusting bedclothes, sheets and blankets)?: None Difficulty moving from lying on back to sitting on the side of the bed? : None Difficulty sitting down on and standing up from a chair with arms (e.g., wheelchair, bedside commode, etc,.)?: None Help needed moving to and from a bed to chair (including a wheelchair)?: A Little Help needed walking in hospital room?: A Little Help needed climbing 3-5 steps with a railing? : A Little 6 Click Score: 21    End of Session Equipment Utilized During Treatment: Gait belt Activity Tolerance: Patient tolerated treatment well Patient left: in chair;with call bell/phone within reach;with family/visitor present Nurse Communication: Mobility status PT Visit Diagnosis: Other symptoms and signs involving the nervous system (R29.898);Pain;Other abnormalities of gait and mobility (R26.89) Pain - Right/Left: Right Pain - part of body: (head and neck)    Time: 1610-96041230-1303 PT Time Calculation (min) (ACUTE ONLY): 33 min   Charges:   PT Evaluation $PT Eval Low Complexity: 1 Low          09/26/2017  Hermosa BingKen Benjaman Callahan, PT Acute Rehabilitation Services [phone]  (pager) [phone]  (office)  Frank Callahan 09/26/2017, 1:29 PM

## 2017-09-26 NOTE — Evaluation (Signed)
Physical Therapy Evaluation Patient Details Name: Frank Callahan MRN: [id number] DOB: April 14, 1965 Today's Date: 09/26/2017   History of Present Illness  pt is a 52 y/o male with no pertinent medical hx, admitted to [facility] ED as a code stroke for sudden right gaze and left side weakness.  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  At time of the evaluation, this patient needs min guard to supervision assist for basic mobility and gait.Marland Kitchen  Pt currently limited functionally due to the problems listed below.  (see problems list.)  Pt will benefit from PT to maximize function and safety to be able to get home safely with available assist.     Follow Up Recommendations Outpatient PT;Supervision/Assistance - 24 hour    Equipment Recommendations  None recommended by PT    Recommendations for Other Services       Precautions / Restrictions Precautions Precautions: (mild fall risk)      Mobility  Bed Mobility Overal bed mobility: Needs Assistance Bed Mobility: Supine to Sit     Supine to sit: Supervision        Transfers Overall transfer level: Needs assistance   Transfers: Sit to/from Stand Sit to Stand: Min guard            Ambulation/Gait Ambulation/Gait assistance: Supervision Gait Distance (Feet): 300 Feet   Gait Pattern/deviations: Step-through pattern Gait velocity: slower Gait velocity interpretation: 1.31 - 2.62 ft/sec, indicative of limited community ambulator General Gait Details: pt generally steady with gait.  Slower than age appropriated, but able to increase speed a bit to cuing.  Pt able to divide his attention and count up and down from 100 by 2's.  When he fatigued from the count, he could continue from where he left off without problem.   No deviation noted with abrupt change of direction or speed changes.  Stairs            Wheelchair Mobility    Modified Rankin (Stroke Patients Only) Modified Rankin (Stroke Patients Only) Pre-Morbid Rankin Score: No symptoms Modified Rankin: Moderate disability     Balance Overall balance assessment: No apparent balance deficits (not formally assessed)                                           Pertinent Vitals/Pain Pain Assessment: Faces Faces Pain Scale: Hurts little more Pain Location: right HA, right neck and abdomen (chronic) Pain Descriptors / Indicators: Sharp;Sore Pain Intervention(s): Monitored during session    Home Living Family/patient expects to be discharged to:: Private residence Living Arrangements: Parent(with parents) Available Help at Discharge: Available 24 hours/day(Dad is retired.) Type of Home: House Home Access: Stairs to enter Entrance Stairs-Rails: Doctor, general practice of Steps: 5 Home Layout: One level Home Equipment: None      Prior Function Level of Independence: Independent         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  tingling/numbness behind his knee.  grossly >4/5 LLE Deficits / Details: generally functional,  grossly 4/5 strength       Communication   Communication: No difficulties  Cognition Arousal/Alertness: Awake/alert Behavior During Therapy: WFL for tasks assessed/performed Overall Cognitive Status: Within Functional Limits for tasks assessed                                        General Comments General comments (skin  integrity, edema, etc.): vss    Exercises     Assessment/Plan    PT Assessment Patient needs continued PT services  PT Problem List Decreased strength;Decreased mobility;Decreased balance;Pain       PT Treatment Interventions Gait training;Functional mobility training;Therapeutic activities;Balance training;Neuromuscular re-education;Patient/family education;Stair training    PT Goals (Current goals can be found in the Care Plan section)  Acute Rehab PT Goals Patient Stated Goal: running,   back to work PT Goal Formulation: With patient Time For Goal Achievement: 10/03/17 Potential to Achieve Goals: Good    Frequency Min 3X/week   Barriers to discharge        Co-evaluation PT/OT/SLP Co-Evaluation/Treatment: Yes Reason for Co-Treatment: For patient/therapist safety PT goals addressed during session: Mobility/safety with mobility OT goals addressed during session: ADL's and self-care       AM-PAC PT "6 Clicks" Daily Activity  Outcome Measure Difficulty turning over in bed (including adjusting bedclothes, sheets and blankets)?: None Difficulty moving from lying on back to sitting on the side of the bed? : None Difficulty sitting down on and standing up from a chair with arms (e.g., wheelchair, bedside commode, etc,.)?: None Help needed moving to and from a bed to chair (including a wheelchair)?: A Little Help needed walking in hospital room?: A Little Help needed climbing 3-5 steps with a railing? : A Little 6 Click Score: 21    End of Session Equipment Utilized During Treatment: Gait belt Activity Tolerance: Patient tolerated treatment well Patient left: in chair;with call bell/phone within reach;with family/visitor present Nurse Communication: Mobility status PT Visit Diagnosis: Other symptoms and signs involving the nervous system (R29.898);Pain;Other abnormalities of gait and mobility (R26.89) Pain - Right/Left: Right Pain - part of body: (head and neck)     Time: 2130-86571230-1303 PT Time Calculation (min) (ACUTE ONLY): 33 min   Charges:   PT Evaluation $PT Eval Low Complexity: 1 Low          09/26/2017  [place] BingKen Jabri Blancett, PT Acute Rehabilitation Services [phone]  (pager) [phone]  (office)  Frank Callahan 09/26/2017, 1:44 PM

## 2017-09-26 NOTE — Plan of Care (Signed)
Pt passed swallow evaluation and is on regular diet.  Will continue to monitor  Frank PurpuraSusan Miyu Fenderson RN

## 2017-09-27 ENCOUNTER — Encounter (HOSPITAL_COMMUNITY): Payer: Self-pay | Admitting: Interventional Radiology

## 2017-09-27 ENCOUNTER — Inpatient Hospital Stay (HOSPITAL_COMMUNITY): Payer: Commercial Managed Care - PPO

## 2017-09-27 DIAGNOSIS — I63511 Cerebral infarction due to unspecified occlusion or stenosis of right middle cerebral artery: Secondary | ICD-10-CM

## 2017-09-27 DIAGNOSIS — I7771 Dissection of carotid artery: Secondary | ICD-10-CM

## 2017-09-27 DIAGNOSIS — E785 Hyperlipidemia, unspecified: Secondary | ICD-10-CM

## 2017-09-27 LAB — BASIC METABOLIC PANEL
Anion gap: 8 (ref 5–15)
BUN: 11 mg/dL (ref 6–20)
CHLORIDE: 108 mmol/L (ref 98–111)
CO2: 26 mmol/L (ref 22–32)
CREATININE: 1.15 mg/dL (ref 0.61–1.24)
Calcium: 8.7 mg/dL — ABNORMAL LOW (ref 8.9–10.3)
GFR calc Af Amer: >60 mL/min (ref >60)
GFR calc non Af Amer: >60 mL/min (ref >60)
GLUCOSE: 106 mg/dL — AB (ref 70–99)
Potassium: 3.7 mmol/L (ref 3.5–5.1)
SODIUM: 142 mmol/L (ref 135–145)

## 2017-09-27 LAB — CBC
HEMATOCRIT: 39.1 % (ref 39.0–52.0)
HEMOGLOBIN: 12.1 g/dL — AB (ref 13.0–17.0)
MCH: 27.6 pg (ref 26.0–34.0)
MCHC: 30.9 g/dL (ref 30.0–36.0)
MCV: 89.1 fL (ref 78.0–100.0)
Platelets: 157 10*3/uL (ref 150–400)
RBC: 4.39 MIL/uL (ref 4.22–5.81)
RDW: 12.4 % (ref 11.5–15.5)
WBC: 5.7 10*3/uL (ref 4.0–10.5)

## 2017-09-27 MED ORDER — IOPAMIDOL (ISOVUE-370) INJECTION 76%
50.0000 mL | Freq: Once | INTRAVENOUS | Status: AC | PRN
  Administered 2017-09-27: 50 mL via INTRAVENOUS

## 2017-09-27 MED ORDER — IOPAMIDOL (ISOVUE-370) INJECTION 76%
INTRAVENOUS | Status: AC
  Filled 2017-09-27: qty 50

## 2017-09-27 MED ORDER — ASPIRIN 325 MG PO TBEC: 325.0000 mg | DELAYED_RELEASE_TABLET | Freq: Every day | ORAL | 0 refills | Status: AC

## 2017-09-27 MED ORDER — ATORVASTATIN CALCIUM 20 MG PO TABS: 20.0000 mg | ORAL_TABLET | Freq: Every day | ORAL | 2 refills | Status: AC

## 2017-09-27 MED ORDER — CLOPIDOGREL BISULFATE 75 MG PO TABS: 75.0000 mg | ORAL_TABLET | Freq: Every day | ORAL | 2 refills | Status: AC

## 2017-09-27 NOTE — Evaluation (Signed)
Speech Language Pathology Evaluation Patient Details Name: Frank ReichmannRicky Callahan MRN: [id number] DOB: 08/09/1965 Today's Date: 09/27/2017 Time: [id number] SLP Time Calculation (min) (ACUTE ONLY): 35 min  Problem List:  Patient Active Problem List   Diagnosis Date Noted  . Stroke (HCC) 09/25/2017  . 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 No pertinent past medical history. Past Surgical History:Mr. Frank ReichmannRicky Kalan is a 52 y.o. male with no PMH of  admitted for acute left sided weakness and right gaze. TPA given. Underwent IR with TICI3 reperfusion.   Assessment / Plan / Recommendation Clinical Impression  Patient was given the MOCA B with score of 22/30- indicating mild cognitive deficits - areas of difficulties included memory, sustained verbal attention and verbal calculations.  Significant strengths were in areas of visual tasks.  Pt also with facial nerve impairment resulting in left facial asymmetry and weakness - ? trigeminal involvement also given oral pocketing on left.  Advised pt to compensation strategies and recommended follow up SLP at OP.      SLP Assessment  SLP Recommendation/Assessment: All further Speech Lanaguage Pathology  needs can be addressed in the next venue of care SLP Visit Diagnosis: Attention and concentration deficit;Cognitive communication deficit (R41.841)    Follow Up Recommendations  None;Outpatient SLP    Frequency and Duration   TBD        SLP Evaluation Cognition  Overall Cognitive Status: Impaired/Different from baseline Arousal/Alertness: Awake/alert Orientation Level: Oriented X4 Attention: Sustained Sustained Attention: Impaired(suspect headache contributing) Memory: Impaired Memory Impairment: Retrieval deficit Awareness: Appears intact Problem Solving: Impaired Problem Solving Impairment: Functional complex Safety/Judgment: Appears intact       Comprehension  Auditory  Comprehension Overall Auditory Comprehension: Appears within functional limits for tasks assessed Yes/No Questions: Not tested Commands: Within Functional Limits Conversation: Complex Visual Recognition/Discrimination Discrimination: Within Function Limits Reading Comprehension Reading Status: Not tested    Expression Expression Primary Mode of Expression: Verbal Verbal Expression Overall Verbal Expression: Appears within functional limits for tasks assessed Initiation: No impairment Level of Generative/Spontaneous Verbalization: Conversation Repetition: No impairment Naming: No impairment Pragmatics: No impairment Written Expression Dominant Hand: Right   Oral / Motor  Oral Motor/Sensory Function Overall Oral Motor/Sensory Function: Mild impairment Facial ROM: Reduced right Facial Symmetry: Abnormal symmetry right Facial Strength: Reduced right Lingual ROM: Reduced right Lingual Symmetry: Within Functional Limits Lingual Strength: Within Functional Limits Lingual Sensation: Within Functional Limits Velum: Within Functional Limits Mandible: Within Functional Limits Motor Speech Overall Motor Speech: Appears within functional limits for tasks assessed Respiration: Within functional limits Phonation: Other (comment)(soft voice is baseline) Resonance: Within functional limits Articulation: Within functional limitis Intelligibility: Intelligible Motor Planning: Witnin functional limits Motor Speech Errors: Not applicable Effective Techniques: Slow rate   GO                    Chales AbrahamsKimball, Sulay Brymer Ann 09/27/2017, 10:28 AM  Donavan Burnetamara Makilah Dowda, MS [facility] SLP Acute Rehab Services Pager [phone] Office [phone]

## 2017-09-27 NOTE — Care Management Note (Signed)
Case Management Note  Patient Details  Name: Frank Callahan MRN: [id number] Date of Birth: [birth year]  Subjective/Objective:    Pt admitted with stroke. He lives in [place], [place] and was in the [place] area working. He was IADL.  PCP: Dr Evangeline Gula Insurance: [facility]                Action/Plan: Pt discharging with orders for outpatient therapy. CM met with the patient and his mother and they decided to have the rehab near [place] and not in [place]. CM was able to locate PT/OT at [facility]. Mother in agreement. CM faxed the orders for [facility].  Pt states their ride to [place] will not be able to pick them up until the am. CM spoke with the patient and his mother and asked about where he was staying while in [place]. Pt was staying with a cousin. CM encouraged him to call and see if they can stay with the cousin tonight and return to [place] in am. Pt to d/c to cousins home.   Expected Discharge Date:  09/27/17               Expected Discharge Plan:  OP Rehab  In-House Referral:     Discharge planning Services  CM Consult  Post Acute Care Choice:    Choice offered to:     DME Arranged:    DME Agency:     HH Arranged:    HH Agency:     Status of Service:  Completed, signed off  If discussed at [employer] of Stay Meetings, dates discussed:    Additional Comments:  Pollie Friar, RN 09/27/2017, 3:27 PM

## 2017-09-27 NOTE — Progress Notes (Signed)
OT Cancellation Note  Patient Details Name: Barbette ReichmannRicky Fiumara MRN: [id number] DOB: 09/15/1965   Cancelled Treatment:    Reason Eval/Treat Not Completed: Pain limiting ability to participate.  Pt with c/o headache, and requests to defer OT this pm.  Reiterated no driving or working  until cleared by OPOT - Pt and mother verbalized understanding  Jeani HawkingWendi Hershal Eriksson, OTR/L Acute Rehabilitation Services Pager [phone] Office [phone]   Jeani HawkingConarpe, Renelle Stegenga M 09/27/2017, 4:59 PM

## 2017-09-27 NOTE — Discharge Summary (Addendum)
Stroke Discharge Summary  Patient ID: Frank Callahan   MRN: [id number]      DOB: 1965/05/09  Date of Admission: 09/25/2017 Date of Discharge: 09/27/2017  Attending Physician:  Marvel Plan, MD, Stroke MD Consultant(s):     Raelyn Ensign, MD (Interventional Neuroradiologist)  Patient's PCP:  Patient, No Pcp Per  DISCHARGE DIAGNOSIS:  Principal Problem:   Middle cerebral artery embolism, right s/p tPA and mechanical thrombectomy Active Problems:   Carotid artery dissection (HCC) right   Hyperlipidemia   Past Medical History:  Diagnosis Date  . Medical history non-contributory    Past Surgical History:  Procedure Laterality Date  . IR CT HEAD LTD  09/25/2017  . IR PERCUTANEOUS ART THROMBECTOMY/INFUSION INTRACRANIAL INC DIAG ANGIO  09/25/2017  . RADIOLOGY WITH ANESTHESIA N/A 09/25/2017   Procedure: IR WITH ANESTHESIA;  Surgeon: Radiologist, Medication, MD;  Location: [facility] OR;  Service: Radiology;  Laterality: N/A;    Allergies as of 09/27/2017      Reactions   Iodinated Diagnostic Agents    Other reaction(s): Nausea and Vomiting   Neurontin [gabapentin]    Amoxicillin Rash   Other reaction(s): Rash Pt states has been taking for 2 weeks and developed a rash Pt states has been taking for 2 weeks and developed a rash   Clarithromycin Rash   Other reaction(s): Rash Pt states has been taking for 2 weeks and then developed a rash Pt states has been taking for 2 weeks and then developed a rash      Medication List    TAKE these medications   acetaminophen 500 MG tablet Commonly known as:  TYLENOL Take 500 mg by mouth every 6 (six) hours as needed for moderate pain.   aspirin 325 MG EC tablet Take 1 tablet (325 mg total) by mouth daily. Start taking on:  09/28/2017   atorvastatin 20 MG tablet Commonly known as:  LIPITOR Take 1 tablet (20 mg total) by mouth daily at 6 PM.   clopidogrel 75 MG tablet Commonly known as:  PLAVIX Take 1 tablet (75 mg total) by mouth  daily. Start taking on:  09/28/2017       LABORATORY STUDIES CBC:  Recent Labs  Lab 09/25/17 0825  09/26/17 0527 09/27/17 0611  WBC 4.6  --  7.5 5.7  NEUTROABS 1.6*  --  5.6  --   HGB 14.0   < > 12.0* 12.1*  HCT 44.7   < > 37.6* 39.1  MCV 88.5  --  89.1 89.1  PLT 193  --  160 157   < > = values in this interval not displayed.   CMP    Component Value Date/Time   NA 142 09/27/2017 0611   K 3.7 09/27/2017 0611   CL 108 09/27/2017 0611   CO2 26 09/27/2017 0611   GLUCOSE 106 (H) 09/27/2017 0611   BUN 11 09/27/2017 0611   CREATININE 1.15 09/27/2017 0611   CALCIUM 8.7 (L) 09/27/2017 0611   PROT 6.5 09/25/2017 0825   ALBUMIN 3.5 09/25/2017 0825   AST 29 09/25/2017 0825   ALT 22 09/25/2017 0825   ALKPHOS 42 09/25/2017 0825   BILITOT 0.8 09/25/2017 0825   GFRNONAA >60 09/27/2017 0611   GFRAA >60 09/27/2017 0611   COAGS Lab Results  Component Value Date   INR 0.97 09/25/2017   Lipid Panel    Component Value Date/Time   CHOL 187 09/26/2017 0527   TRIG 67 09/26/2017 0527   HDL 67 09/26/2017  0527   CHOLHDL 2.8 09/26/2017 0527   VLDL 13 09/26/2017 0527   LDLCALC 107 (H) 09/26/2017 0527   HgbA1C  Lab Results  Component Value Date   HGBA1C 6.1 (H) 09/26/2017   Urinalysis    Component Value Date/Time   COLORURINE COLORLESS (A) 09/25/2017 1817   APPEARANCEUR CLEAR 09/25/2017 1817   LABSPEC 1.012 09/25/2017 1817   PHURINE 5.0 09/25/2017 1817   GLUCOSEU NEGATIVE 09/25/2017 1817   HGBUR NEGATIVE 09/25/2017 1817   BILIRUBINUR NEGATIVE 09/25/2017 1817   KETONESUR NEGATIVE 09/25/2017 1817   PROTEINUR NEGATIVE 09/25/2017 1817   NITRITE NEGATIVE 09/25/2017 1817   LEUKOCYTESUR NEGATIVE 09/25/2017 1817   Urine Drug Screen     Component Value Date/Time   LABOPIA NONE DETECTED 09/25/2017 1817   COCAINSCRNUR NONE DETECTED 09/25/2017 1817   LABBENZ NONE DETECTED 09/25/2017 1817   AMPHETMU NONE DETECTED 09/25/2017 1817   THCU NONE DETECTED 09/25/2017 1817   LABBARB  NONE DETECTED 09/25/2017 1817    Alcohol Level    Component Value Date/Time   ETH <10 09/25/2017 1453      SIGNIFICANT DIAGNOSTIC STUDIES Ct Angio Head W Or Wo Contrast  Result Date: 09/25/2017 CLINICAL DATA:  Code stroke.  Right-sided gaze. EXAM: CT ANGIOGRAPHY HEAD AND NECK TECHNIQUE: Multidetector CT imaging of the head and neck was performed using the standard protocol during bolus administration of intravenous contrast. Multiplanar CT image reconstructions and MIPs were obtained to evaluate the vascular anatomy. Carotid stenosis measurements (when applicable) are obtained utilizing NASCET criteria, using the distal internal carotid diameter as the denominator. CONTRAST:  50 cc Isovue 370 COMPARISON:  Head CT earlier same day FINDINGS: CTA NECK FINDINGS Aortic arch: Normal Right carotid system: Common carotid artery widely patent to the bifurcation. Carotid bifurcation shows soft plaque/thrombus in the ICA bulb. Minimal diameter is 3.5 mm. Compared to a more distal cervical ICA diameter of 5 mm, this indicates a 30% stenosis. Left carotid system: Common carotid artery widely patent to the bifurcation. Carotid bifurcation is normal. Cervical ICA is normal. Vertebral arteries: Both vertebral arteries are widely patent at their origins and through the cervical region to the foramen magnum. Skeleton: Negative Other neck: Negative Upper chest: Negative Review of the MIP images confirms the above findings CTA HEAD FINDINGS Anterior circulation: Right ICA is small because of inflow reduction. There is embolic occlusion of the supraclinoid ICA and proximal MCA on the right. Some collateral reconstitution of more distal MCA branches. Left ACA and MCA are normal an widely patent. Posterior circulation: Both vertebral arteries widely patent to the basilar. Basilar fenestration of no significance. Posterior circulation branch vessels are normal. No patent posterior communicating artery visible on the right.  Venous sinuses: Patent and normal. Anatomic variants: None significant. Delayed phase: No abnormal enhancement. 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 Soft plaque/thrombus in the ICA bulb on the right with stenosis of 30%. No other vascular pathology. The patient does not demonstrate widespread atherosclerotic disease. These results were communicated to Dr. Wilford Corner At 8:58 amon 9/11/2019by text page via the [facility] messaging system. Electronically Signed   By: Paulina Fusi M.D.   On: 09/25/2017 09:00   Ct Head Wo Contrast  Result Date: 09/26/2017 CLINICAL DATA:  Stroke.  Post clot retrieval 09/25/2017 EXAM: CT HEAD WITHOUT CONTRAST TECHNIQUE: Contiguous axial images were obtained from the base of the skull through the vertex without intravenous contrast. COMPARISON:  MRI head 09/26/2017 FINDINGS: Brain: Acute infarct in the right head of caudate, right putamen and anterior limb internal capsule is noted on MRI. Acute infarct in the right anterior insula with small patchy areas of hypodensity in the right frontal lobe also unchanged from the MRI. Small area of acute infarct in the right parietal lobe unchanged from MRI. Negative for hemorrhage. Ventricle size normal. Slight midline shift to the left due to edema in the right basal ganglia. Vascular: Negative for hyperdense vessel Skull: Negative Sinuses/Orbits: Mild mucosal edema paranasal sinuses.  Normal orbit Other: None IMPRESSION: Acute infarct in the right MCA territory primarily involving the right basal ganglia but also the right frontal and right parietal lobe similar distribution MRI earlier today. No acute hemorrhage. Electronically Signed   By: Marlan Palau M.D.   On: 09/26/2017 10:43   Ct Angio Neck W Or Wo Contrast  Result Date: 09/27/2017 CLINICAL DATA:  52 y/o  M; 52 y/o  M; stroke for follow-up. EXAM: CT ANGIOGRAPHY NECK TECHNIQUE:  Multidetector CT imaging of the neck was performed using the standard protocol during bolus administration of intravenous contrast. Multiplanar CT image reconstructions and MIPs were obtained to evaluate the vascular anatomy. Carotid stenosis measurements (when applicable) are obtained utilizing NASCET criteria, using the distal internal carotid diameter as the denominator. CONTRAST:  50 cc Isovue 370 COMPARISON:  09/26/2017 CT head, MRI head, MRA head. 09/25/2017 CT angiogram of the neck. FINDINGS: Aortic arch: Standard branching. Imaged portion shows no evidence of aneurysm or dissection. No significant stenosis of the major arch vessel origins. Right carotid system: No evidence of dissection, stenosis (50% or greater) or occlusion. Stable fibrofatty plaque of the right proximal ICA with mild less than 50% stenosis. Left carotid system: No evidence of dissection, stenosis (50% or greater) or occlusion. Vertebral arteries: Codominant. No evidence of dissection, stenosis (50% or greater) or occlusion. Skeleton: Negative. Other neck: Negative. Upper chest: Negative. IMPRESSION: 1. Stable patent carotid and vertebral arteries. No dissection, aneurysm, or hemodynamically significant stenosis utilizing NASCET criteria. 2. Stable right proximal ICA fibrofatty plaque with mild less than 50% stenosis. Electronically Signed   By: Mitzi Hansen M.D.   On: 09/27/2017 02:32   Ct Angio Neck W And/or Wo Contrast  Result Date: 09/25/2017 CLINICAL DATA:  Code stroke.  Right-sided gaze. EXAM: CT ANGIOGRAPHY HEAD AND NECK TECHNIQUE: Multidetector CT imaging of the head and neck was performed using the standard protocol during bolus administration of intravenous contrast. Multiplanar CT image reconstructions and MIPs were obtained to evaluate the vascular anatomy. Carotid stenosis measurements (when applicable) are obtained utilizing NASCET criteria, using the distal internal carotid diameter as the denominator.  CONTRAST:  50 cc Isovue 370 COMPARISON:  Head CT earlier same day FINDINGS: CTA NECK FINDINGS Aortic arch: Normal Right carotid system: Common carotid artery widely patent to the bifurcation. Carotid bifurcation shows soft plaque/thrombus in the ICA bulb. Minimal diameter is 3.5 mm. Compared to a more distal cervical ICA diameter of 5 mm, this indicates a 30% stenosis. Left carotid system: Common carotid artery widely patent to the bifurcation. Carotid bifurcation is normal. Cervical ICA is normal. Vertebral arteries: Both vertebral arteries are widely patent at their origins and through the cervical region to the foramen magnum. Skeleton: Negative Other neck: Negative Upper chest: Negative Review of the MIP images confirms the above findings CTA HEAD FINDINGS Anterior circulation: Right ICA is small because of inflow reduction. There is embolic occlusion of the supraclinoid ICA and proximal MCA on the  right. Some collateral reconstitution of more distal MCA branches. Left ACA and MCA are normal an widely patent. Posterior circulation: Both vertebral arteries widely patent to the basilar. Basilar fenestration of no significance. Posterior circulation branch vessels are normal. No patent posterior communicating artery visible on the right. Venous sinuses: Patent and normal. Anatomic variants: None significant. Delayed phase: No abnormal enhancement. 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 Soft plaque/thrombus in the ICA bulb on the right with stenosis of 30%. No other vascular pathology. The patient does not demonstrate widespread atherosclerotic disease. These results were communicated to Dr. Wilford Corner At 8:58 amon 9/11/2019by text page via the [facility] messaging system. Electronically Signed   By: Paulina Fusi M.D.   On: 09/25/2017 09:00   Mr Brain Wo Contrast  Result Date: 09/26/2017 CLINICAL DATA:  52  y/o M; right supraclinoid ICA and MCA occlusion post intra-arterial intervention. EXAM: MRI HEAD WITHOUT CONTRAST MRA HEAD WITHOUT CONTRAST TECHNIQUE: Multiplanar, multiecho pulse sequences of the brain and surrounding structures were obtained without intravenous contrast. Angiographic images of the head were obtained using MRA technique without contrast. COMPARISON:  09/25/2017 CT head, CTA head, angiogram head. FINDINGS: MRI HEAD FINDINGS Brain: Reduced diffusion with T2 FLAIR hyperintensity and mild local mass effect involving the right caudate nucleus, right lentiform nucleus, right insular cortex, and right frontal operculum compatible with early subacute infarction. Additional small foci of infarction are present in the right superolateral frontal lobe, right parietal lobe, and right posterior temporal lobe. Punctate foci of susceptibility hypointensity are present within the right lentiform nucleus compatible with petechial hemorrhage. Mass effect results in partial effacement of the right lateral ventricle and 2 mm of right-to-left midline shift. No extra-axial collection, hydrocephalus, effacement of basilar cisterns, or herniation. Vascular: As below. Skull and upper cervical spine: Normal marrow signal. Sinuses/Orbits: Mild diffuse paranasal sinus mucosal thickening and partial opacification of the mastoid air cells. Other: None. MRA HEAD FINDINGS Internal carotid arteries:  Patent. Anterior cerebral arteries:  Patent. Middle cerebral arteries: Patent bilaterally. Mild lumen irregularity proximal stenosis of right M1 segment. Anterior communicating artery: Patent. Posterior communicating arteries:  Patent. Posterior cerebral arteries:  Patent. Basilar artery:  Patent.  Lower basilar fenestration. Vertebral arteries:  Patent. No evidence of high-grade stenosis, large vessel occlusion, or aneurysm. IMPRESSION: 1. Early subacute infarction involving right basal ganglia, right insula, and right frontal  operculum. Additional small scattered infarcts in the right frontal, parietal, and temporal lobes. Petechial hemorrhage within the right caudate nucleus. 2. Patent anterior and posterior intracranial circulation. No new vessel occlusion/reocclusion. Mild lumen irregularity and mild proximal stenosis of the right M1 segment. These results will be called to the ordering clinician or representative by the Radiologist Assistant, and communication documented in the PACS or zVision Dashboard. Electronically Signed   By: Mitzi Hansen M.D.   On: 09/26/2017 06:47   Ir Ct Head Ltd  Result Date: 09/27/2017 INDICATION: Acute onset of right gaze deviation, and left-sided weakness. Severe dysarthria. Hyperdense right MCA sign. CT angiogram revealing occluded right internal carotid artery terminus, the right middle cerebral artery and right anterior cerebral artery. EXAM: 1. EMERGENT LARGE VESSEL OCCLUSION THROMBOLYSIS (anterior CIRCULATION) COMPARISON:  CT angiogram of the head and neck of 09/25/2017. MEDICATIONS: Ancef 2 g IV antibiotic was administered within 1 hour of the procedure. ANESTHESIA/SEDATION: General anesthesia CONTRAST:  Isovue 300 approximately 140 mL FLUOROSCOPY TIME:  Fluoroscopy Time: 65 minutes 30  seconds (2999. mGy). COMPLICATIONS: Nnone immediateone immediate. TECHNIQUE: An emergency consent was signed by 2 physicians as no family members or next of kin were available on the phone or physically. The patient was then put under general anesthesia by the Department of Anesthesiology at [facility], [place]. The right groin was prepped and draped in the usual sterile fashion. Thereafter using modified Seldinger technique, transfemoral access into the right common femoral artery was obtained without difficulty. Over a 0.035 inch guidewire a 5 French Pinnacle sheath was inserted. Through this, and also over a 0.035 inch guidewire a 5 [place] JB 1 catheter was advanced to the aortic arch region and  selectively positioned in the innominate artery and the right common carotid artery. FINDINGS: The innominate artery angiogram demonstrates wide patency of the right subclavian artery and the right common carotid artery. The right vertebral artery origin appears widely patent. The vessel is seen to opacify to the cranial skull base to supply the right vertebrobasilar junction, including filling of the right posterior-inferior cerebellar artery, the superior cerebellar arteries and the anterior-inferior cerebellar arteries on the lateral images. A right common carotid arteriogram demonstrates the right external carotid artery and its major branches to be widely patent. The right internal carotid artery at the bulb demonstrates mild stenosis associated with a triangular-shaped filling defect probably representing a plaque. No associated ulcerations were seen. The right internal carotid artery was seen to opacify to the cranial skull base. Patency of the internal carotid artery was noted to just cranial to the origin of the anterior choroidal artery, and the left posterior communicating artery. No angiographic opacification was demonstrated of the right middle cerebral artery or the anterior cerebral artery on the right side. PROCEDURE: ENDOVASCULAR COMPLETE REVASCULARIZATION OF OCCLUDED RIGHT INTERNAL CAROTID ARTERY SUPRACLINOID SEGMENT, THE RIGHT MIDDLE CEREBRAL ARTERY, AND RIGHT ANTERIOR CEREBRAL ARTERY IN ENTIRETY ACHIEVING A TICI 3 REVASCULARIZATION OF THE MIDDLE AND THE ANTERIOR CEREBRAL ARTERY ON THE RIGHT. The diagnostic JB 1 catheter in the right common carotid artery was exchanged over a 0.035 inch 300 cm Rosen exchange guidewire for an 8 French 55 cm Brite tip neurovascular sheath. Good aspiration obtained from the side port of the neurovascular sheath. This was then connected to continuous heparinized saline infusion. Over the [facility] exchange guidewire, an 85 cm 8 French balloon FlowGate guide catheter which  had been prepped with 50% contrast and 50% heparinized saline infusion was advanced and positioned just proximal to the right common carotid bifurcation. The guidewire was removed. Good aspiration obtained from the hub of the [facility] guide catheter. Gentle contrast injection demonstrated no change in the intracranial circulation. Again noted was the completely occluded internal carotid artery in the supraclinoid segment just distal to the origin of the anterior choroidal artery. At this time, a combination of a 5 French 115 cm guide catheter inside of which was a 021 Trevo ProVue microcatheter was advanced and positioned over a 0.014 inch Softip Synchro micro guidewire to the distal end of the [facility] guide catheter. With the micro guidewire leading with a J-tip configuration, the combination of the microcatheter inside of the Navien 5 [place] guide catheter was advanced to the distal cavernous segment of the right internal carotid artery. The micro guidewire was then gingerly advanced with a J configuration through the occlusion into the right middle cerebral artery M2 M3 region of the inferior division. The micro guidewire was removed. Good aspiration obtained from the hub of the microcatheter. Gentle contrast injection demonstrated safe position of the  tip of the microcatheter with antegrade clearance of contrast. A 5 mm x 33 mm Embotrap retrieval device was then advanced to the distal end of the microcatheter. The proximal and distal landing zones were then identified. Thereafter, the O ring on the delivery microcatheter was loosened. With slight forward gentle traction with the right hand on the delivery micro guidewire, with left hand the delivery microcatheter was retrieved deploying the entire retrieval device. The Navien guide catheter was positioned in the cavernous segment of the right internal carotid artery. Thereafter, with proximal flow arrest by inflating the balloon of the [facility] guide catheter  in the right internal carotid artery proximally, the combination of the retrieval device, the microcatheter, and the Navien guide catheter was retrieved as constant aspiration was applied with a 60 mL syringe at the hub of the [facility] guide catheter. This combination was retrieved and removed. Aspiration was continued as the balloon was deflated in the right internal carotid artery. Clot was noted at the hub of the Tuoh[employer] [place]. Chunks of clot were noted entangled in the retrieval device, and also the aspirate. A control arteriogram performed through the 8 [place] FlowGate guide catheter in the right common carotid artery demonstrated now completely opacified supraclinoid right ICA, the right anterior cerebral artery to the level of the distal A1 segment, and the middle cerebral artery just distal to the anterior temporal branch. Significant vasospasm was noted of the right internal carotid artery. This responded to 225 mcg aliquots of nitroglycerin. A second pass was made as described above again with the combination of the 021 microcatheter inside of a 5 [place] Navien guide catheter over a 0.014 inch Softip Synchro microguidewire. Again access was obtained without difficulty into dominant inferior division. An Embotrap 5 mm x 33 mm device was again placed after confirming safe position of the tip of the microcatheter in the M2 M3 region of the inferior division of the right middle cerebral artery. A gentle control arteriogram performed through the guide catheter in the right internal carotid artery now demonstrated a TICI 2 revascularization with proximal flow arrest in the right internal carotid artery with the FlowGate guide catheter inflated balloon, the combination of the retrieval device, the microcatheter and the Navien guide catheter were performed as previously. Again this time clots were noted in the [place] and also in the retrieval device. Free back bleed of blood was noted following deflation of the  balloon in the right internal carotid artery as constant aspiration was continued. A gentle control arteriogram performed through the right internal carotid artery FlowGate guide catheter now demonstrated a complete revascularization of the dominant inferior division, the anterior temporal branch. Also noted now was improved revascularization of the right anterior cerebral A1 segment. A prominent pericallosal artery was seen to be occluded with a clot. Also noted was a stump at the origin of the superior division. A third pass was then made again with the Trevo ProVue microcatheter combination, the Navien 5 [place] guide catheter over a 0.014 inch Softip Synchro micro guidewire. The superior division was then accessed. Following removal of the wire, there was resistance to the aspiration possibly related to vasospasm. The microcatheter was gently retrieved more proximally until free aspiration of blood was noted. Thereafter, the 5 mm x 30 mm Embotrap retrieval device was positioned as described earlier. Again with proximal flow arrest, by inflating the balloon in the right internal carotid artery of the [facility] guide catheter, the combination of the retrieval device, the microcatheter, and the  Navien guide catheter was performed as constant aspiration with a 60 mL syringe was performed at the hub of the 8 [place] FlowGate guide catheter. Following removal of the combination, aspiration was continued as the balloon was deflated. Free back bleed of blood was noted at the hub of the [facility] guide catheter. Again small clot was noted engaged in the Tuohy [place]. The aspirate had few specks of clot as well. Following a reversal flow arrest, a control arteriogram performed through the right internal carotid artery demonstrated complete TICI 3 reperfusion of the right middle cerebral artery distribution. There continued to be occluded dominant right pericallosal artery proximally. Again a combination of the Navien 5 French  115 cm guide catheter inside of which was an 021 Trevo ProVue microcatheter was advanced over a 0.014 inch Softip Synchro micro guidewire to the distal end of the right internal carotid artery in the terminus region. The micro guidewire was then gently manipulated using a torque device to gain entrance into the right anterior cerebral artery opacity occluded proximal pericallosal artery followed by the microcatheter. The guidewire was removed. There was good aspiration of blood at the hub of the microcatheter. A gentle contrast injection demonstrated mild spasm but antegrade flow. A 3 mm x 20 mm Trevo ProVue retrieval device was then advanced to the distal end of the microcatheter. The proximal and the distal landing zones were identified. Thereafter with proximal flow arrest, and after having deployed the retrieval device, with constant aspiration at the hub of the [facility] guide catheter, the combination of the retrieval device, the microcatheter and the Navien guide catheter were retrieved and removed. A piece of clot was retrieved in the interstices of the retrieval device. After free back bleed of blood was obtained following deflation of the balloon, a control arteriogram performed through the 8 [place] FlowGate guide catheter in the right internal carotid artery demonstrated complete angiographic revascularization of the anterior cerebral artery distribution. A control arteriogram performed through the 8 [place] FlowGate guide catheter in the right common carotid artery continued to demonstrate excellent flow with relaxation of spasm of the right internal carotid artery in its entirety. There was now continued opacification of the right middle cerebral artery and the right anterior cerebral artery achieving a TICI 3 reperfusion. Throughout the procedure, the patient's blood pressure and neurological status remained stable. The 8 [place] FlowGate guide catheter and the 8 [place] neurovascular sheath were then  retrieved into the abdominal aorta and exchanged over a J-tip guidewire for an 8 [place] Pinnacle sheath. This in turn was then removed with the successful application of an 8 [place] Angio-Seal. The patient's right groin appeared soft without evidence of a hematoma. Distal pulses in the dorsalis pedis, and posterior tibials remained palpable in both feet at the end of the procedure unchanged. The patient's general anesthesia was then reversed and the patient was extubated without difficulty. Upon recovery, the patient commands though slowly. He was able to bend his left knee. However, no motor movement was noted in the left arm. A Dyna CT of the brain performed at the end of the procedure demonstrated no evidence of gross hemorrhages or mass effect or midline shift. There was suspicion of gyriform enhancement in the peri-insular area. The patient was then transferred to the neuro PACU and then neuro ICU for post stroke management IMPRESSION: Status post endovascular complete revascularization of the right middle cerebral artery, the right internal carotid artery terminus, and the right anterior cerebral artery, with 3 passes with the  Embotrap 5 mm x 33 mm retrieval device, and 1 pass with the 3 mm x 20 mm Trevo ProVue retrieval device as described above. The patient's right internal carotid artery bulb remained patent with no change in the triangular-shaped smooth defect probably representing an atherosclerotic plaque at the end of the procedure. PLAN: Post stroke, neuro ICU. Patient well be seen in the clinic in 4 weeks post discharge. The right internal carotid artery above will be evaluated with CT angiogram of the head and neck. Electronically Signed   By: Julieanne Cotton M.D.   On: 09/26/2017 11:21   Mr Maxine Glenn Head Wo Contrast  Result Date: 09/26/2017 CLINICAL DATA:  52 y/o M; right supraclinoid ICA and MCA occlusion post intra-arterial intervention. EXAM: MRI HEAD WITHOUT CONTRAST MRA HEAD WITHOUT CONTRAST  TECHNIQUE: Multiplanar, multiecho pulse sequences of the brain and surrounding structures were obtained without intravenous contrast. Angiographic images of the head were obtained using MRA technique without contrast. COMPARISON:  09/25/2017 CT head, CTA head, angiogram head. FINDINGS: MRI HEAD FINDINGS Brain: Reduced diffusion with T2 FLAIR hyperintensity and mild local mass effect involving the right caudate nucleus, right lentiform nucleus, right insular cortex, and right frontal operculum compatible with early subacute infarction. Additional small foci of infarction are present in the right superolateral frontal lobe, right parietal lobe, and right posterior temporal lobe. Punctate foci of susceptibility hypointensity are present within the right lentiform nucleus compatible with petechial hemorrhage. Mass effect results in partial effacement of the right lateral ventricle and 2 mm of right-to-left midline shift. No extra-axial collection, hydrocephalus, effacement of basilar cisterns, or herniation. Vascular: As below. Skull and upper cervical spine: Normal marrow signal. Sinuses/Orbits: Mild diffuse paranasal sinus mucosal thickening and partial opacification of the mastoid air cells. Other: None. MRA HEAD FINDINGS Internal carotid arteries:  Patent. Anterior cerebral arteries:  Patent. Middle cerebral arteries: Patent bilaterally. Mild lumen irregularity proximal stenosis of right M1 segment. Anterior communicating artery: Patent. Posterior communicating arteries:  Patent. Posterior cerebral arteries:  Patent. Basilar artery:  Patent.  Lower basilar fenestration. Vertebral arteries:  Patent. No evidence of high-grade stenosis, large vessel occlusion, or aneurysm. IMPRESSION: 1. Early subacute infarction involving right basal ganglia, right insula, and right frontal operculum. Additional small scattered infarcts in the right frontal, parietal, and temporal lobes. Petechial hemorrhage within the right caudate  nucleus. 2. Patent anterior and posterior intracranial circulation. No new vessel occlusion/reocclusion. Mild lumen irregularity and mild proximal stenosis of the right M1 segment. These results will be called to the ordering clinician or representative by the Radiologist Assistant, and communication documented in the PACS or zVision Dashboard. Electronically Signed   By: Mitzi Hansen M.D.   On: 09/26/2017 06:47   Ir Percutaneous Art Thrombectomy/infusion Intracranial Inc Diag Angio  Result Date: 09/27/2017 INDICATION: Acute onset of right gaze deviation, and left-sided weakness. Severe dysarthria. Hyperdense right MCA sign. CT angiogram revealing occluded right internal carotid artery terminus, the right middle cerebral artery and right anterior cerebral artery. EXAM: 1. EMERGENT LARGE VESSEL OCCLUSION THROMBOLYSIS (anterior CIRCULATION) COMPARISON:  CT angiogram of the head and neck of 09/25/2017. MEDICATIONS: Ancef 2 g IV antibiotic was administered within 1 hour of the procedure. ANESTHESIA/SEDATION: General anesthesia CONTRAST:  Isovue 300 approximately 140 mL FLUOROSCOPY TIME:  Fluoroscopy Time: 65 minutes 30 seconds (2999. mGy). COMPLICATIONS: Nnone immediateone immediate. TECHNIQUE: An emergency consent was signed by 2 physicians as no family members or next of kin were available on the phone or physically. The patient was then put under  general anesthesia by the Department of Anesthesiology at [facility]. The right groin was prepped and draped in the usual sterile fashion. Thereafter using modified Seldinger technique, transfemoral access into the right common femoral artery was obtained without difficulty. Over a 0.035 inch guidewire a 5 French Pinnacle sheath was inserted. Through this, and also over a 0.035 inch guidewire a 5 [place] JB 1 catheter was advanced to the aortic arch region and selectively positioned in the innominate artery and the right common carotid artery.  FINDINGS: The innominate artery angiogram demonstrates wide patency of the right subclavian artery and the right common carotid artery. The right vertebral artery origin appears widely patent. The vessel is seen to opacify to the cranial skull base to supply the right vertebrobasilar junction, including filling of the right posterior-inferior cerebellar artery, the superior cerebellar arteries and the anterior-inferior cerebellar arteries on the lateral images. A right common carotid arteriogram demonstrates the right external carotid artery and its major branches to be widely patent. The right internal carotid artery at the bulb demonstrates mild stenosis associated with a triangular-shaped filling defect probably representing a plaque. No associated ulcerations were seen. The right internal carotid artery was seen to opacify to the cranial skull base. Patency of the internal carotid artery was noted to just cranial to the origin of the anterior choroidal artery, and the left posterior communicating artery. No angiographic opacification was demonstrated of the right middle cerebral artery or the anterior cerebral artery on the right side. PROCEDURE: ENDOVASCULAR COMPLETE REVASCULARIZATION OF OCCLUDED RIGHT INTERNAL CAROTID ARTERY SUPRACLINOID SEGMENT, THE RIGHT MIDDLE CEREBRAL ARTERY, AND RIGHT ANTERIOR CEREBRAL ARTERY IN ENTIRETY ACHIEVING A TICI 3 REVASCULARIZATION OF THE MIDDLE AND THE ANTERIOR CEREBRAL ARTERY ON THE RIGHT. The diagnostic JB 1 catheter in the right common carotid artery was exchanged over a 0.035 inch 300 cm Rosen exchange guidewire for an 8 French 55 cm Brite tip neurovascular sheath. Good aspiration obtained from the side port of the neurovascular sheath. This was then connected to continuous heparinized saline infusion. Over the [facility] exchange guidewire, an 85 cm 8 French balloon FlowGate guide catheter which had been prepped with 50% contrast and 50% heparinized saline infusion was advanced  and positioned just proximal to the right common carotid bifurcation. The guidewire was removed. Good aspiration obtained from the hub of the [facility] guide catheter. Gentle contrast injection demonstrated no change in the intracranial circulation. Again noted was the completely occluded internal carotid artery in the supraclinoid segment just distal to the origin of the anterior choroidal artery. At this time, a combination of a 5 French 115 cm guide catheter inside of which was a 021 Trevo ProVue microcatheter was advanced and positioned over a 0.014 inch Softip Synchro micro guidewire to the distal end of the [facility] guide catheter. With the micro guidewire leading with a J-tip configuration, the combination of the microcatheter inside of the Navien 5 [place] guide catheter was advanced to the distal cavernous segment of the right internal carotid artery. The micro guidewire was then gingerly advanced with a J configuration through the occlusion into the right middle cerebral artery M2 M3 region of the inferior division. The micro guidewire was removed. Good aspiration obtained from the hub of the microcatheter. Gentle contrast injection demonstrated safe position of the tip of the microcatheter with antegrade clearance of contrast. A 5 mm x 33 mm Embotrap retrieval device was then advanced to the distal end of the microcatheter. The proximal and distal landing zones were then identified.  Thereafter, the O ring on the delivery microcatheter was loosened. With slight forward gentle traction with the right hand on the delivery micro guidewire, with left hand the delivery microcatheter was retrieved deploying the entire retrieval device. The Navien guide catheter was positioned in the cavernous segment of the right internal carotid artery. Thereafter, with proximal flow arrest by inflating the balloon of the [facility] guide catheter in the right internal carotid artery proximally, the combination of the retrieval  device, the microcatheter, and the Navien guide catheter was retrieved as constant aspiration was applied with a 60 mL syringe at the hub of the [facility]. This combination was retrieved and removed. Aspiration was continued as the balloon was deflated in the right internal carotid artery. Clot was noted at the hub of the [place]. Chunks of clot were noted entangled in the retrieval device, and also the aspirate. A control arteriogram performed through the 8 [place] FlowGate guide catheter in the right common carotid artery demonstrated now completely opacified supraclinoid right ICA, the right anterior cerebral artery to the level of the distal A1 segment, and the middle cerebral artery just distal to the anterior temporal branch. Significant vasospasm was noted of the right internal carotid artery. This responded to 225 mcg aliquots of nitroglycerin. A second pass was made as described above again with the combination of the 021 microcatheter inside of a 5 [place] Navien guide catheter over a 0.014 inch Softip Synchro microguidewire. Again access was obtained without difficulty into dominant inferior division. An Embotrap 5 mm x 33 mm device was again placed after confirming safe position of the tip of the microcatheter in the M2 M3 region of the inferior division of the right middle cerebral artery. A gentle control arteriogram performed through the guide catheter in the right internal carotid artery now demonstrated a TICI 2 revascularization with proximal flow arrest in the right internal carotid artery with the FlowGate guide catheter inflated balloon, the combination of the retrieval device, the microcatheter and the Navien guide catheter were performed as previously. Again this time clots were noted in the [place] and also in the retrieval device. Free back bleed of blood was noted following deflation of the balloon in the right internal carotid artery as constant aspiration was  continued. A gentle control arteriogram performed through the right internal carotid artery FlowGate guide catheter now demonstrated a complete revascularization of the dominant inferior division, the anterior temporal branch. Also noted now was improved revascularization of the right anterior cerebral A1 segment. A prominent pericallosal artery was seen to be occluded with a clot. Also noted was a stump at the origin of the superior division. A third pass was then made again with the Trevo ProVue microcatheter combination, the Navien 5 [place] guide catheter over a 0.014 inch Softip Synchro micro guidewire. The superior division was then accessed. Following removal of the wire, there was resistance to the aspiration possibly related to vasospasm. The microcatheter was gently retrieved more proximally until free aspiration of blood was noted. Thereafter, the 5 mm x 30 mm Embotrap retrieval device was positioned as described earlier. Again with proximal flow arrest, by inflating the balloon in the right internal carotid artery of the FlowGate guide catheter, the combination of the retrieval device, the microcatheter, and the Navien guide catheter was performed as constant aspiration with a 60 mL syringe was performed at the hub of the 8 [place] FlowGate guide catheter. Following removal of the combination, aspiration was continued as the balloon was  deflated. Free back bleed of blood was noted at the hub of the [facility] guide catheter. Again small clot was noted engaged in the [place]. The aspirate had few specks of clot as well. Following a reversal flow arrest, a control arteriogram performed through the right internal carotid artery demonstrated complete TICI 3 reperfusion of the right middle cerebral artery distribution. There continued to be occluded dominant right pericallosal artery proximally. Again a combination of the Navien 5 French 115 cm guide catheter inside of which was an 021 Trevo ProVue  microcatheter was advanced over a 0.014 inch Softip Synchro micro guidewire to the distal end of the right internal carotid artery in the terminus region. The micro guidewire was then gently manipulated using a torque device to gain entrance into the right anterior cerebral artery opacity occluded proximal pericallosal artery followed by the microcatheter. The guidewire was removed. There was good aspiration of blood at the hub of the microcatheter. A gentle contrast injection demonstrated mild spasm but antegrade flow. A 3 mm x 20 mm Trevo ProVue retrieval device was then advanced to the distal end of the microcatheter. The proximal and the distal landing zones were identified. Thereafter with proximal flow arrest, and after having deployed the retrieval device, with constant aspiration at the hub of the [facility] guide catheter, the combination of the retrieval device, the microcatheter and the Navien guide catheter were retrieved and removed. A piece of clot was retrieved in the interstices of the retrieval device. After free back bleed of blood was obtained following deflation of the balloon, a control arteriogram performed through the 8 [place] FlowGate guide catheter in the right internal carotid artery demonstrated complete angiographic revascularization of the anterior cerebral artery distribution. A control arteriogram performed through the 8 [place] FlowGate guide catheter in the right common carotid artery continued to demonstrate excellent flow with relaxation of spasm of the right internal carotid artery in its entirety. There was now continued opacification of the right middle cerebral artery and the right anterior cerebral artery achieving a TICI 3 reperfusion. Throughout the procedure, the patient's blood pressure and neurological status remained stable. The 8 [place] FlowGate guide catheter and the 8 [place] neurovascular sheath were then retrieved into the abdominal aorta and exchanged over a J-tip  guidewire for an 8 [place] Pinnacle sheath. This in turn was then removed with the successful application of an 8 [place] Angio-Seal. The patient's right groin appeared soft without evidence of a hematoma. Distal pulses in the dorsalis pedis, and posterior tibials remained palpable in both feet at the end of the procedure unchanged. The patient's general anesthesia was then reversed and the patient was extubated without difficulty. Upon recovery, the patient commands though slowly. He was able to bend his left knee. However, no motor movement was noted in the left arm. A Dyna CT of the brain performed at the end of the procedure demonstrated no evidence of gross hemorrhages or mass effect or midline shift. There was suspicion of gyriform enhancement in the peri-insular area. The patient was then transferred to the neuro PACU and then neuro ICU for post stroke management IMPRESSION: Status post endovascular complete revascularization of the right middle cerebral artery, the right internal carotid artery terminus, and the right anterior cerebral artery, with 3 passes with the Embotrap 5 mm x 33 mm retrieval device, and 1 pass with the 3 mm x 20 mm Trevo ProVue retrieval device as described above. The patient's right internal carotid artery bulb remained patent with no change  in the triangular-shaped smooth defect probably representing an atherosclerotic plaque at the end of the procedure. PLAN: Post stroke, neuro ICU. Patient well be seen in the clinic in 4 weeks post discharge. The right internal carotid artery above will be evaluated with CT angiogram of the head and neck. Electronically Signed   By: Julieanne Cotton M.D.   On: 09/26/2017 11:21   Ct Head Code Stroke Wo Contrast  Result Date: 09/25/2017 CLINICAL DATA:  Code stroke. Unexplained altered level of consciousness. EXAM: CT HEAD WITHOUT CONTRAST TECHNIQUE: Contiguous axial images were obtained from the base of the skull through the vertex without  intravenous contrast. COMPARISON:  None. FINDINGS: Brain: Question early loss of gray-white differentiation in the insular region on the right. Otherwise, the brain parenchyma appears normal. No mass, hemorrhage, hydrocephalus or extra-axial collection. Vascular: Question hyperdense right MCA. Skull: Negative Sinuses/Orbits: Mild mucosal thickening.  Orbits negative. Other: None ASPECTS ([place] Stroke Program Early CT Score) - Ganglionic level infarction (caudate, lentiform nuclei, internal capsule, insula, M1-M3 cortex): 7 - Supraganglionic infarction (M4-M6 cortex): 3 Total score (0-10 with 10 being normal): 10 IMPRESSION: 1. Possible early loss of gray-white differentiation in the insular region on the right. Question hyperdense right MCA 2. ASPECTS is 9. 3. These results were communicated to [place] at 8:45 amon 9/11/2019by text page via the [facility] messaging system. Electronically Signed   By: Paulina Fusi M.D.   On: 09/25/2017 08:47   TTE  Normal LV size with mild LV hypertrophy. EF 60-65%. Normal RVsize and systolic function. No significant valvularabnormalities.  LE venous doppler pending  Negative for DVT     HISTORY OF PRESENT ILLNESS [place] Rr Doe  (Kennan Skalsky DOB 09/21/1965) is a 52  Year old AA male brought in as a John Doe with unknown PMH. Presented to [facility] ED as a code stroke for sudden right side gaze and left side weakness.   Patient woke up in his usual state of health and went to work. 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 EMS was called and a code stroke was activated. 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 No prior history of stroke.  Modified Rankin: Rankin Score=0. NIHSS:20. TPA decision was made at 0833 and TPA started at 0835. IR was called at [phone]. Denies any anticoagulation, recent surgeries, GI bleeds. Two physician consent was performed d/t patient  drowsy status and no family information. he had TICI3 revascularization of R ICA and MCA occlusion.    HOSPITAL COURSE Mr. Nabil Bubolz is a 52 y.o. male with no PMH of  admitted for acute left sided weakness and right gaze. TPA given. Underwent IR with TICI3 reperfusion.  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  Resultant left UE mild weakness, left mild facial droop  CT no acute abnormality  CTA h/n right ICA thrombus, right ICA terminus and MCA occlusion  DSA right tICA and MCA occlusion s/p TICI3 reperfusion  MRI  Right MCA multifocal and punctate ACA infarcts  MRA  Right MCA patent with mild irregularity  CT head R MCA infarct R BG, R frontal and R parietal, similar to earlier  CTA neck repeat stable carotid and VAs, stable R ICA fibrofatty plaque < 50% stenosis  2D Echo EF 60-65%  LE venous  doppler no DVT  LDL 107  HgbA1c 6.1  UDS neg  No antithrombotic prior to admission, now on aspirin 325 mg daily and clopidogrel 75 mg daily for possible right ICA dissection. continue x 3 months  Repeat CT angio in 2-3 months.  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  DISCHARGE EXAM Blood pressure 105/75, pulse 72, temperature 98.7 F (37.1 C), temperature source Oral, resp. rate 20, height 5\' 9"  (1.753 m), weight 64.9 kg, SpO2 100 %. General - Well nourished, well developed, in no apparent  distress. Cardiovascular - Regular rate and rhythm.  Mental Status -  Level of arousal and orientation to time, place, and person were intact. Language including expression, naming, repetition, comprehension was assessed and found intact. Fund of Knowledge was assessed and was intact.  Cranial Nerves II - XII - II - Visual field intact OU. III, IV, VI - Extraocular movements intact. V - Facial sensation intact bilaterally. VII - left facial droop mild. VIII - Hearing & vestibular intact bilaterally. X - Palate elevates symmetrically. XI - Chin turning & shoulder shrug intact bilaterally. XII - Tongue protrusion intact.  Motor Strength - The patient's strength was normal in all extremities except LUE 4+/5 proximal and distal and pronator drift was absent.  Bulk was normal and fasciculations were absent.   Motor Tone - Muscle tone was assessed at the neck and appendages and was normal.  Sensory - Light touch, temperature/pinprick were assessed and were symmetrical.   Coordination - The patient had normal movements in the hands with no ataxia or dysmetria.  Tremor was absent. Gait and Station - deferred.  Discharge Diet  Regular diet, thin liquids  DISCHARGE PLAN  Disposition:  Home  Outpatient PT and OT  aspirin 325 mg daily and clopidogrel 75 mg daily for secondary stroke prevention x 3 months   Ongoing risk factor control by Primary Care Physician at time of discharge  Follow-up Patient, No Pcp Per in 2 weeks.  Follow-up in [facility] in 4 weeks, office to schedule an appointment.   45 minutes were spent preparing discharge.  Annie Main, MSN, APRN, ANVP-BC, AGPCNP-BC Advanced Practice Stroke Nurse [facility] See Amion for Schedule & Pager information 09/27/2017 3:56 PM   ATTENDING NOTE: I reviewed above note and agree with the assessment and plan. Pt was seen and examined.   Pt neuro stable, no acute event.  Worked with PT/OT well and recommend outpt PT/OT. Repeat CTA neck showed continue right ICA soft plaque vs. Thrombus which seems not affected by IR procedure. Will need to repeat image in 2-3 months. Continue DAPT for at least 2-3 months. Continue statin. Pt ready for discharge. He will follow up with [facility] stroke clinic in 4 weeks.   Marvel Plan, MD PhD Stroke Neurology 09/27/2017 10:38 PM

## 2017-09-27 NOTE — Progress Notes (Signed)
Referring Physician(s): CODE STROKE  Supervising Physician: Julieanne Cotton  Patient Status:  [facility] - In-pt  Chief Complaint: None  Subjective:  Right MCA, right ACA, and right ICA supraclinoid segment occlusions s/p revascularization 09/25/2017 with Dr. Corliss Skains. Patient awake and alert laying in bed with no complaints at this time. Accompanied by mother at bedside. No focal neurologic symptoms demonstrated.   Allergies: Iodinated diagnostic agents; Neurontin [gabapentin]; Amoxicillin; and Clarithromycin  Medications: Prior to Admission medications   Medication Sig Start Date End Date Taking? Authorizing Provider  acetaminophen (TYLENOL) 500 MG tablet Take 500 mg by mouth every 6 (six) hours as needed for moderate pain.   Yes [provider]     Vital Signs: BP 108/77 (BP Location: Right Arm)   Pulse 70   Temp 98.3 F (36.8 C) (Oral)   Resp 20   Ht 5\' 9"  (1.753 m)   Wt 143 lb 1.3 oz (64.9 kg)   SpO2 100%   BMI 21.13 kg/m   Physical Exam  Constitutional: He appears well-developed and well-nourished. No distress.  Pulmonary/Chest: Effort normal. No respiratory distress.  Neurological:  Alert, awake, and oriented x3. Speech and comprehension intact. PERRL bilaterally. EOMs intact bilaterally without nystagmus or subjective diplopia. Visual fields not assessed. No facial asymmetry. Tongue midline. Motor power symmetric proportional to effort. Pronator drift not assessed. Fine motor and coordination not assessed. Gait not assessed. Romberg not assessed. Heel to toe not assessed. Distal pulses 2+ bilaterally.  Skin: Skin is warm and dry.  Right groin incision soft without active bleeding or hematoma.  Psychiatric: He has a normal mood and affect. His behavior is normal. Judgment and thought content normal.  Nursing note and vitals reviewed.   Imaging: Ct Angio Head W Or Wo Contrast  Result Date: 09/25/2017 CLINICAL DATA:  Code stroke.   Right-sided gaze. EXAM: CT ANGIOGRAPHY HEAD AND NECK TECHNIQUE: Multidetector CT imaging of the head and neck was performed using the standard protocol during bolus administration of intravenous contrast. Multiplanar CT image reconstructions and MIPs were obtained to evaluate the vascular anatomy. Carotid stenosis measurements (when applicable) are obtained utilizing NASCET criteria, using the distal internal carotid diameter as the denominator. CONTRAST:  50 cc Isovue 370 COMPARISON:  Head CT earlier same day FINDINGS: CTA NECK FINDINGS Aortic arch: Normal Right carotid system: Common carotid artery widely patent to the bifurcation. Carotid bifurcation shows soft plaque/thrombus in the ICA bulb. Minimal diameter is 3.5 mm. Compared to a more distal cervical ICA diameter of 5 mm, this indicates a 30% stenosis. Left carotid system: Common carotid artery widely patent to the bifurcation. Carotid bifurcation is normal. Cervical ICA is normal. Vertebral arteries: Both vertebral arteries are widely patent at their origins and through the cervical region to the foramen magnum. Skeleton: Negative Other neck: Negative Upper chest: Negative Review of the MIP images confirms the above findings CTA HEAD FINDINGS Anterior circulation: Right ICA is small because of inflow reduction. There is embolic occlusion of the supraclinoid ICA and proximal MCA on the right. Some collateral reconstitution of more distal MCA branches. Left ACA and MCA are normal an widely patent. Posterior circulation: Both vertebral arteries widely patent to the basilar. Basilar fenestration of no significance. Posterior circulation branch vessels are normal. No patent posterior communicating artery visible on the right. Venous sinuses: Patent and normal. Anatomic variants: None significant. Delayed phase: No abnormal enhancement. Review of the MIP images confirms the above findings IMPRESSION: Acute embolic occlusion of the supraclinoid ICA and middle  cerebral artery on the right. Soft plaque/thrombus in the ICA bulb on the right with stenosis of 30%. No other vascular pathology. The patient does not demonstrate widespread atherosclerotic disease. These results were communicated to Dr. Wilford Corner At 8:58 amon 9/11/2019by text page via the [facility] messaging system. Electronically Signed   By: Paulina Fusi M.D.   On: 09/25/2017 09:00   Ct Head Wo Contrast  Result Date: 09/26/2017 CLINICAL DATA:  Stroke.  Post clot retrieval 09/25/2017 EXAM: CT HEAD WITHOUT CONTRAST TECHNIQUE: Contiguous axial images were obtained from the base of the skull through the vertex without intravenous contrast. COMPARISON:  MRI head 09/26/2017 FINDINGS: Brain: Acute infarct in the right head of caudate, right putamen and anterior limb internal capsule is noted on MRI. Acute infarct in the right anterior insula with small patchy areas of hypodensity in the right frontal lobe also unchanged from the MRI. Small area of acute infarct in the right parietal lobe unchanged from MRI. Negative for hemorrhage. Ventricle size normal. Slight midline shift to the left due to edema in the right basal ganglia. Vascular: Negative for hyperdense vessel Skull: Negative Sinuses/Orbits: Mild mucosal edema paranasal sinuses.  Normal orbit Other: None IMPRESSION: Acute infarct in the right MCA territory primarily involving the right basal ganglia but also the right frontal and right parietal lobe similar distribution MRI earlier today. No acute hemorrhage. Electronically Signed   By: Marlan Palau M.D.   On: 09/26/2017 10:43   Ct Angio Neck W Or Wo Contrast  Result Date: 09/27/2017 CLINICAL DATA:  52 y/o  M; 52 y/o  M; stroke for follow-up. EXAM: CT ANGIOGRAPHY NECK TECHNIQUE: Multidetector CT imaging of the neck was performed using the standard protocol during bolus administration of intravenous contrast. Multiplanar CT image reconstructions and MIPs were obtained to evaluate the vascular anatomy. Carotid  stenosis measurements (when applicable) are obtained utilizing NASCET criteria, using the distal internal carotid diameter as the denominator. CONTRAST:  50 cc Isovue 370 COMPARISON:  09/26/2017 CT head, MRI head, MRA head. 09/25/2017 CT angiogram of the neck. FINDINGS: Aortic arch: Standard branching. Imaged portion shows no evidence of aneurysm or dissection. No significant stenosis of the major arch vessel origins. Right carotid system: No evidence of dissection, stenosis (50% or greater) or occlusion. Stable fibrofatty plaque of the right proximal ICA with mild less than 50% stenosis. Left carotid system: No evidence of dissection, stenosis (50% or greater) or occlusion. Vertebral arteries: Codominant. No evidence of dissection, stenosis (50% or greater) or occlusion. Skeleton: Negative. Other neck: Negative. Upper chest: Negative. IMPRESSION: 1. Stable patent carotid and vertebral arteries. No dissection, aneurysm, or hemodynamically significant stenosis utilizing NASCET criteria. 2. Stable right proximal ICA fibrofatty plaque with mild less than 50% stenosis. Electronically Signed   By: Mitzi Hansen M.D.   On: 09/27/2017 02:32   Ct Angio Neck W And/or Wo Contrast  Result Date: 09/25/2017 CLINICAL DATA:  Code stroke.  Right-sided gaze. EXAM: CT ANGIOGRAPHY HEAD AND NECK TECHNIQUE: Multidetector CT imaging of the head and neck was performed using the standard protocol during bolus administration of intravenous contrast. Multiplanar CT image reconstructions and MIPs were obtained to evaluate the vascular anatomy. Carotid stenosis measurements (when applicable) are obtained utilizing NASCET criteria, using the distal internal carotid diameter as the denominator. CONTRAST:  50 cc Isovue 370 COMPARISON:  Head CT earlier same day FINDINGS: CTA NECK FINDINGS Aortic arch: Normal Right carotid system: Common carotid artery widely patent to the bifurcation. Carotid bifurcation shows soft plaque/thrombus  in  the ICA bulb. Minimal diameter is 3.5 mm. Compared to a more distal cervical ICA diameter of 5 mm, this indicates a 30% stenosis. Left carotid system: Common carotid artery widely patent to the bifurcation. Carotid bifurcation is normal. Cervical ICA is normal. Vertebral arteries: Both vertebral arteries are widely patent at their origins and through the cervical region to the foramen magnum. Skeleton: Negative Other neck: Negative Upper chest: Negative Review of the MIP images confirms the above findings CTA HEAD FINDINGS Anterior circulation: Right ICA is small because of inflow reduction. There is embolic occlusion of the supraclinoid ICA and proximal MCA on the right. Some collateral reconstitution of more distal MCA branches. Left ACA and MCA are normal an widely patent. Posterior circulation: Both vertebral arteries widely patent to the basilar. Basilar fenestration of no significance. Posterior circulation branch vessels are normal. No patent posterior communicating artery visible on the right. Venous sinuses: Patent and normal. Anatomic variants: None significant. Delayed phase: No abnormal enhancement. 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 Soft plaque/thrombus in the ICA bulb on the right with stenosis of 30%. No other vascular pathology. The patient does not demonstrate widespread atherosclerotic disease. These results were communicated to Dr. Wilford CornerArora At 8:58 amon 9/11/2019by text page via the [facility] messaging system. Electronically Signed   By: Paulina FusiMark  Shogry M.D.   On: 09/25/2017 09:00   Mr Brain Wo Contrast  Result Date: 09/26/2017 CLINICAL DATA:  52 y/o M; right supraclinoid ICA and MCA occlusion post intra-arterial intervention. EXAM: MRI HEAD WITHOUT CONTRAST MRA HEAD WITHOUT CONTRAST TECHNIQUE: Multiplanar, multiecho pulse sequences of the brain and surrounding structures were  obtained without intravenous contrast. Angiographic images of the head were obtained using MRA technique without contrast. COMPARISON:  09/25/2017 CT head, CTA head, angiogram head. FINDINGS: MRI HEAD FINDINGS Brain: Reduced diffusion with T2 FLAIR hyperintensity and mild local mass effect involving the right caudate nucleus, right lentiform nucleus, right insular cortex, and right frontal operculum compatible with early subacute infarction. Additional small foci of infarction are present in the right superolateral frontal lobe, right parietal lobe, and right posterior temporal lobe. Punctate foci of susceptibility hypointensity are present within the right lentiform nucleus compatible with petechial hemorrhage. Mass effect results in partial effacement of the right lateral ventricle and 2 mm of right-to-left midline shift. No extra-axial collection, hydrocephalus, effacement of basilar cisterns, or herniation. Vascular: As below. Skull and upper cervical spine: Normal marrow signal. Sinuses/Orbits: Mild diffuse paranasal sinus mucosal thickening and partial opacification of the mastoid air cells. Other: None. MRA HEAD FINDINGS Internal carotid arteries:  Patent. Anterior cerebral arteries:  Patent. Middle cerebral arteries: Patent bilaterally. Mild lumen irregularity proximal stenosis of right M1 segment. Anterior communicating artery: Patent. Posterior communicating arteries:  Patent. Posterior cerebral arteries:  Patent. Basilar artery:  Patent.  Lower basilar fenestration. Vertebral arteries:  Patent. No evidence of high-grade stenosis, large vessel occlusion, or aneurysm. IMPRESSION: 1. Early subacute infarction involving right basal ganglia, right insula, and right frontal operculum. Additional small scattered infarcts in the right frontal, parietal, and temporal lobes. Petechial hemorrhage within the right caudate nucleus. 2. Patent anterior and posterior intracranial circulation. No new vessel  occlusion/reocclusion. Mild lumen irregularity and mild proximal stenosis of the right M1 segment. These results will be called to the ordering clinician or representative by the Radiologist Assistant, and communication documented in the PACS or zVision Dashboard. Electronically Signed   By:  Mitzi Hansen M.D.   On: 09/26/2017 06:47   Mr Maxine Glenn Head Wo Contrast  Result Date: 09/26/2017 CLINICAL DATA:  52 y/o M; right supraclinoid ICA and MCA occlusion post intra-arterial intervention. EXAM: MRI HEAD WITHOUT CONTRAST MRA HEAD WITHOUT CONTRAST TECHNIQUE: Multiplanar, multiecho pulse sequences of the brain and surrounding structures were obtained without intravenous contrast. Angiographic images of the head were obtained using MRA technique without contrast. COMPARISON:  09/25/2017 CT head, CTA head, angiogram head. FINDINGS: MRI HEAD FINDINGS Brain: Reduced diffusion with T2 FLAIR hyperintensity and mild local mass effect involving the right caudate nucleus, right lentiform nucleus, right insular cortex, and right frontal operculum compatible with early subacute infarction. Additional small foci of infarction are present in the right superolateral frontal lobe, right parietal lobe, and right posterior temporal lobe. Punctate foci of susceptibility hypointensity are present within the right lentiform nucleus compatible with petechial hemorrhage. Mass effect results in partial effacement of the right lateral ventricle and 2 mm of right-to-left midline shift. No extra-axial collection, hydrocephalus, effacement of basilar cisterns, or herniation. Vascular: As below. Skull and upper cervical spine: Normal marrow signal. Sinuses/Orbits: Mild diffuse paranasal sinus mucosal thickening and partial opacification of the mastoid air cells. Other: None. MRA HEAD FINDINGS Internal carotid arteries:  Patent. Anterior cerebral arteries:  Patent. Middle cerebral arteries: Patent bilaterally. Mild lumen irregularity  proximal stenosis of right M1 segment. Anterior communicating artery: Patent. Posterior communicating arteries:  Patent. Posterior cerebral arteries:  Patent. Basilar artery:  Patent.  Lower basilar fenestration. Vertebral arteries:  Patent. No evidence of high-grade stenosis, large vessel occlusion, or aneurysm. IMPRESSION: 1. Early subacute infarction involving right basal ganglia, right insula, and right frontal operculum. Additional small scattered infarcts in the right frontal, parietal, and temporal lobes. Petechial hemorrhage within the right caudate nucleus. 2. Patent anterior and posterior intracranial circulation. No new vessel occlusion/reocclusion. Mild lumen irregularity and mild proximal stenosis of the right M1 segment. These results will be called to the ordering clinician or representative by the Radiologist Assistant, and communication documented in the PACS or zVision Dashboard. Electronically Signed   By: Mitzi Hansen M.D.   On: 09/26/2017 06:47   Ct Head Code Stroke Wo Contrast  Result Date: 09/25/2017 CLINICAL DATA:  Code stroke. Unexplained altered level of consciousness. EXAM: CT HEAD WITHOUT CONTRAST TECHNIQUE: Contiguous axial images were obtained from the base of the skull through the vertex without intravenous contrast. COMPARISON:  None. FINDINGS: Brain: Question early loss of gray-white differentiation in the insular region on the right. Otherwise, the brain parenchyma appears normal. No mass, hemorrhage, hydrocephalus or extra-axial collection. Vascular: Question hyperdense right MCA. Skull: Negative Sinuses/Orbits: Mild mucosal thickening.  Orbits negative. Other: None ASPECTS ([place] Stroke Program Early CT Score) - Ganglionic level infarction (caudate, lentiform nuclei, internal capsule, insula, M1-M3 cortex): 7 - Supraganglionic infarction (M4-M6 cortex): 3 Total score (0-10 with 10 being normal): 10 IMPRESSION: 1. Possible early loss of gray-white differentiation  in the insular region on the right. Question hyperdense right MCA 2. ASPECTS is 9. 3. These results were communicated to Wilford Corner at 8:45 amon 9/11/2019by text page via the [facility] messaging system. Electronically Signed   By: Paulina Fusi M.D.   On: 09/25/2017 08:47    Labs:  CBC: Recent Labs    09/25/17 0825 09/25/17 0828 09/26/17 0527 09/27/17 0611  WBC 4.6  --  7.5 5.7  HGB 14.0 15.0 12.0* 12.1*  HCT 44.7 44.0 37.6* 39.1  PLT 193  --  160 157  COAGS: Recent Labs    09/25/17 0825  INR 0.97  APTT 25    BMP: Recent Labs    09/25/17 0825 09/25/17 0828 09/26/17 0527 09/27/17 0611  NA 140 140 141 142  K 4.4 4.0 3.7 3.7  CL 104 105 110 108  CO2 27  --  25 26  GLUCOSE 109* 106* 111* 106*  BUN 16 23* 8 11  CALCIUM 9.3  --  8.3* 8.7*  CREATININE 1.28* 1.30* 1.23 1.15  GFRNONAA >60  --  >60 >60  GFRAA >60  --  >60 >60    LIVER FUNCTION TESTS: Recent Labs    09/25/17 0825  BILITOT 0.8  AST 29  ALT 22  ALKPHOS 42  PROT 6.5  ALBUMIN 3.5    Assessment and Plan:  Right MCA, right ACA, and right ICA supraclinoid segment occlusions s/p revascularization 09/25/2017 with Dr. Corliss Skains. Patient's condition improving- no focal neurologic symptoms noted. Right groin incision stable. Appreciate and agree with neurology management. Plan to follow-up with Dr. Corliss Skains in clinic 4 weeks after discharge- patient to have CTA head/neck (with contrast) prior to clinic appointment, will notify our schedulers of plan. Please call IR with questions/concerns.   Electronically Signed: Elwin Mocha, PA-C 09/27/2017, 11:24 AM   I spent a total of 15 Minutes at the the patient's bedside AND on the patient's hospital floor or unit, greater than 50% of which was counseling/coordinating care for right MCA, right ACA, and right ICA supraclinoid segment occlusions s/p revascularization.

## 2017-09-27 NOTE — Progress Notes (Signed)
Physical Therapy Treatment Patient Details Name: Frank Callahan MRN: [id number] DOB: 1965/02/18 Today's Date: 09/27/2017    History of Present Illness pt is a 52 y/o male with no pertinent medical hx, admitted to [facility] ED as a code stroke for sudden right gaze and left side weakness.  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  Likely source being right ICA dissection s/p head injury/    PT Comments    Patient progressing and met initial PT goals, but updated for more focus on high level balance, L LE coordination and functional mobility.  Feel  Continued skilled PT indicated to progress to maximized independence and safety prior to d/c home with family and follow up outpatient neurorehabilitation.  Follow Up Recommendations  Outpatient PT;Supervision/Assistance - 24 hour     Equipment Recommendations  None recommended by PT    Recommendations for Other Services       Precautions / Restrictions Precautions Precautions: None    Mobility  Bed Mobility Overal bed mobility: Independent                Transfers Overall transfer level: Independent   Transfers: Sit to/from Stand              Ambulation/Gait Ambulation/Gait assistance: Supervision Gait Distance (Feet): 300 Feet Assistive device: None Gait Pattern/deviations: Step-through pattern;Wide base of support;Decreased stride length     General Gait Details: cues for more forward progression of L LE for more narrow gait   Stairs Stairs: Yes Stairs assistance: Supervision Stair Management: No rails;Two rails;Forwards;Alternating pattern Number of Stairs: 2(x 3 trials) General stair comments: initially using rails, then cues to not use rails and performed safetly x 2   Wheelchair Mobility    Modified Rankin (Stroke Patients Only) Modified Rankin (Stroke Patients Only) Pre-Morbid Rankin Score: No symptoms Modified Rankin: Moderate  disability     Balance Overall balance assessment: Needs assistance   Sitting balance-Leahy Scale: Normal       Standing balance-Leahy Scale: Good Standing balance comment: high level balance activities performed in hallway; forward tandem, side stepping head turns, braiding, forward tandem head turns and toe and heel walking with UE support                 Standardized Balance Assessment Standardized Balance Assessment : Dynamic Gait Index   Dynamic Gait Index Level Surface: Mild Impairment Change in Gait Speed: Mild Impairment Gait with Horizontal Head Turns: Normal Gait with Vertical Head Turns: Normal Gait and Pivot Turn: Normal Step Over Obstacle: Mild Impairment Step Around Obstacles: Normal Steps: Normal Total Score: 21      Cognition Arousal/Alertness: Awake/alert Behavior During Therapy: Flat affect Overall Cognitive Status: Impaired/Different from baseline Area of Impairment: Safety/judgement;Problem solving                         Safety/Judgement: Decreased awareness of safety;Decreased awareness of deficits   Problem Solving: Slow processing General Comments: Did not know why he needed to work on high level balance 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 General comments (skin integrity, edema, etc.): mother present throughout      Pertinent Vitals/Pain Pain Assessment: 0-10 Pain Score: 2  Faces Pain Scale: Hurts a little bit Pain Location: head Pain Descriptors / Indicators: Headache Pain Intervention(s): Monitored during session    Home Living     Available  Help at Discharge: Family(mom and dad are retired) Type of Home: House              Prior Function            PT Goals (current goals can now be found in the care plan section) Progress towards PT goals: Goals met and updated - see care plan    Frequency    Min 3X/week      PT Plan Current plan remains  appropriate    Co-evaluation              AM-PAC PT "6 Clicks" Daily Activity  Outcome Measure  Difficulty turning over in bed (including adjusting bedclothes, sheets and blankets)?: None Difficulty moving from lying on back to sitting on the side of the bed? : None Difficulty sitting down on and standing up from a chair with arms (e.g., wheelchair, bedside commode, etc,.)?: None Help needed moving to and from a bed to chair (including a wheelchair)?: None Help needed walking in hospital room?: None Help needed climbing 3-5 steps with a railing? : None 6 Click Score: 24    End of Session Equipment Utilized During Treatment: Gait belt Activity Tolerance: Patient tolerated treatment well Patient left: with call bell/phone within reach;in bed;with family/visitor present   PT Visit Diagnosis: Other symptoms and signs involving the nervous system (R29.898);Pain;Other abnormalities of gait and mobility (R26.89) Pain - Right/Left: Right     Time: 3825-0539 PT Time Calculation (min) (ACUTE ONLY): 26 min  Charges:  $Gait Training: 8-22 mins $Neuromuscular Re-education: 8-22 mins                     Frank Callahan, [place] [facility] [phone] 09/27/2017    Frank Callahan 09/27/2017, 1:14 PM

## 2017-09-28 LAB — LUPUS ANTICOAGULANT PANEL
DRVVT: 31.8 s (ref 0.0–47.0)
PTT LA: 32.2 s (ref 0.0–51.9)

## 2017-09-28 LAB — BETA-2-GLYCOPROTEIN I ABS, IGG/M/A: Beta-2-Glycoprotein I IgA: <9 GPI IgA units (ref 0–25)

## 2017-09-28 LAB — CARDIOLIPIN ANTIBODIES, IGG, IGM, IGA

## 2017-09-30 LAB — HOMOCYSTEINE: Homocysteine: 10.2 umol/L (ref 0.0–15.0)

## 2017-10-31 ENCOUNTER — Telehealth: Payer: Self-pay | Admitting: Adult Health

## 2017-10-31 ENCOUNTER — Ambulatory Visit: Payer: Commercial Managed Care - PPO | Admitting: Adult Health

## 2017-10-31 ENCOUNTER — Ambulatory Visit
Admission: RE | Admit: 2017-10-31 | Discharge: 2017-10-31 | Disposition: A | Discharge status: Home / Self Care | Payer: Commercial Managed Care - PPO | Source: Ambulatory Visit | Attending: Adult Health | Admitting: Adult Health

## 2017-10-31 ENCOUNTER — Encounter: Payer: Self-pay | Admitting: Adult Health

## 2017-10-31 VITALS — BP 109/80 | HR 72 | Ht 69.0 in | Wt 140.0 lb

## 2017-10-31 DIAGNOSIS — I7771 Dissection of carotid artery: Secondary | ICD-10-CM | POA: Diagnosis not present

## 2017-10-31 DIAGNOSIS — E785 Hyperlipidemia, unspecified: Secondary | ICD-10-CM | POA: Diagnosis not present

## 2017-10-31 DIAGNOSIS — I63411 Cerebral infarction due to embolism of right middle cerebral artery: Secondary | ICD-10-CM | POA: Diagnosis not present

## 2017-10-31 MED ORDER — IOPAMIDOL (ISOVUE-370) INJECTION 76%
75.0000 mL | Freq: Once | INTRAVENOUS | Status: AC | PRN
  Administered 2017-10-31: 75 mL via INTRAVENOUS

## 2017-10-31 MED ORDER — DULOXETINE HCL 60 MG PO CPEP: 60.0000 mg | ORAL_CAPSULE | Freq: Every day | ORAL | 4 refills | Status: DC

## 2017-10-31 NOTE — Patient Instructions (Signed)
Continue aspirin 325 mg daily and clopidogrel 75 mg daily  and lipitor 20mg   for secondary stroke prevention  Continue to follow up with PCP regarding cholesterol and blood pressure management   Start Cymbalta 60mg  nightly for nerve pain  We will check CT CTA head/neck to rule out potential new stroke or worsening dissection  Referral placed for PT/OT/ST - you will be called to schedule appointment  Continue to monitor blood pressure at home  Maintain strict control of hypertension with blood pressure goal below 130/90, diabetes with hemoglobin A1c goal below 6.5% and cholesterol with LDL cholesterol (bad cholesterol) goal below 70 mg/dL. I also advised the patient to eat a healthy diet with plenty of whole grains, cereals, fruits and vegetables, exercise regularly and maintain ideal body weight.  Followup in the future with me in 2 months or call earlier if needed       Thank you for coming to see [place] at [facility]. I hope we have been able to provide you high quality care today.  You may receive a patient satisfaction survey over the next few weeks. We would appreciate your feedback and comments so that we may continue to improve ourselves and the health of our patients.

## 2017-10-31 NOTE — Progress Notes (Signed)
[facility] [address] [place]. [name] [address] [phone]       OFFICE FOLLOW UP NOTE  Mr. Frank Callahan Date of Birth:  Mar 10, 1965 Medical Record Number:  [id number]   Reason for Referral:  hospital stroke follow up  CHIEF COMPLAINT:  Chief Complaint  Patient presents with  . 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 History obtained from patient, mother, sister and chart review. Reviewed all radiology images and labs personally.  Frank Callahan is a 52 year old male without significant PMH who presented to [facility] ED for sudden right-sided gaze and left-sided weakness which occurred shortly after he was at work on a job site (works in [employer]) and the trunk.  CT head was negative for acute abnormality and CTA head and neck showed right ICA thrombus, right ICA terminus and MCA occlusion therefore TPA was administered and IR was consulted.  He underwent right ICA and MCA TICI 3 reperfusion due to right ICA and MCA occlusion.  MRI brain reviewed and showed right MCA multifocal punctate ACA infarcts.  MRA showed right MCA TMs with mild irregularity.  Repeat CT showed right MCA infarct, right BG, right frontal and right parietal infarcts.  CTA head and neck reviewed showed stable carotid Vas and stable right ICA fibrofatty plaque less than 50% stenosis.  2D echo showed an EF of 60 to 65%.  Lower extremity Doppler was negative for DVT.  Patient was not on antithrombotic PTA and recommended aspirin and Plavix for 3 months due to possible right ICA dissection with recommendation  of repeat CTA in 2 to 3 months time.  Per notes, patient does work at [employer] site and hit his head shortly before symptoms presented and this is most likely the cause for his possible right ICA dissection.  LDL 107 and recommended initiating Lipitor 20 mg daily.  Patient was discharged home with recommendations of outpatient PT/OT.  Patient is being seen today for hospital follow-up and is accompanied by his sister and mother.  He does state approximately 2 weeks ago he woke up from a nap and found his left arm and leg completely flaccid.  This weakness gradually improved over a week and was back to his baseline weakness.  He does state that he was seeing improvement after discharge but this has recently set him back.  He also states approximate 1 week ago he had increased blurry vision in his right eye along with "right eyebrow pain" and states it is difficult to see close objects and denies the symptoms in the past.  He denies any recent falls, head trauma, or quick neck movements.  He has not returned to work or driving at this time.  He does endorse some left upper and lower extremity numbness with nerve pain along with intermittent neck pain and headaches.  He does take Tylenol as needed for the neck pain/headaches.  He continues to take aspirin and Plavix without side effects of bleeding or bruising.  Continues to take Lipitor without side effects myalgias.  Pressure today satisfactory 109/80.  He does state  that he participated in PT for 1 visit and as blood pressure was at systolics 104, he was sent to the hospital for further evaluation and was not called to reschedule future follow-up appointments.  No further concerns at this time.   Upon assessment, patient was found to have left hemianopia along with left hemiparesis and footdrop with decreased sensation on both left upper and lower extremity.   ROS:   14 system review of systems performed and negative with exception of chest pain,  birthmarks, waiting, double vision, eye pain, feeling cold, joint pain, ecchymosis, skin sensitivity, memory loss, numbness, weakness, decreased energy, change in appetite,   PMH:  Past Medical History:  Diagnosis Date  . Medical history non-contributory   . Stroke [facility])     PSH:  Past Surgical History:  Procedure Laterality Date  . IR CT HEAD LTD  09/25/2017  . IR PERCUTANEOUS ART THROMBECTOMY/INFUSION INTRACRANIAL INC DIAG ANGIO  09/25/2017  . RADIOLOGY WITH ANESTHESIA N/A 09/25/2017   Procedure: IR WITH ANESTHESIA;  Surgeon: Radiologist, Medication, MD;  Location: [facility] OR;  Service: Radiology;  Laterality: N/A;    Social History:  Social History   Socioeconomic History  . Marital status: Unknown    Spouse name: Not on file  . Number of children: Not on file  . Years of education: Not on file  . Highest education level: Not on file  Occupational History  . Not on file  Social Needs  . Financial resource strain: Not on file  . Food insecurity:    Worry: Not on file    Inability: Not on file  . Transportation needs:    Medical: Not on file    Non-medical: Not on file  Tobacco Use  . Smoking status: Never Smoker  . Smokeless tobacco: Never Used  Substance and Sexual Activity  . Alcohol use: Not Currently  . Drug use: Not Currently  . Sexual activity: Not on file  Lifestyle  . Physical activity:    Days per week: Not on file    Minutes per session: Not on file  . Stress: Not on file  Relationships  . Social connections:    Talks on phone: Not on file    Gets together: Not on file    Attends religious service: Not on file    Active member of club or organization: Not on file    Attends meetings of clubs or organizations: Not on file    Relationship status: Not on file  . Intimate partner violence:    Fear of current or ex partner: Not on file    Emotionally abused: Not on file    Physically abused: Not on file    Forced sexual activity: Not on file  Other Topics  Concern  . Not on file  Social History Narrative  . Not on file    Family History: History reviewed. No pertinent family history.  Medications:   Current Outpatient Medications on File Prior to Visit  Medication Sig Dispense Refill  . acetaminophen (TYLENOL) 500 MG tablet Take 500 mg by mouth every 6 (six) hours as needed for moderate pain.    Marland Kitchen amLODipine (NORVASC) 5 MG tablet TK 1 T PO QD FOR BP  3  . aspirin EC 325 MG EC tablet Take 1 tablet (325 mg total) by mouth daily. 30 tablet 0  . atorvastatin (LIPITOR) 20 MG tablet Take 1 tablet (20 mg total) by mouth daily at 6 PM. 30 tablet 2  . clopidogrel (  PLAVIX) 75 MG tablet Take 1 tablet (75 mg total) by mouth daily. 30 tablet 2   No current facility-administered medications on file prior to visit.     Allergies:   Allergies  Allergen Reactions  . Iodinated Diagnostic Agents     Other reaction(s): Nausea and Vomiting  . Neurontin [Gabapentin]   . Amoxicillin Rash    Other reaction(s): Rash Pt states has been taking for 2 weeks and developed a rash Pt states has been taking for 2 weeks and developed a rash   . Clarithromycin Rash    Other reaction(s): Rash Pt states has been taking for 2 weeks and then developed a rash Pt states has been taking for 2 weeks and then developed a rash      Physical Exam  Vitals:   10/31/17 1246  BP: 109/80  Pulse: 72  Weight: 140 lb (63.5 kg)  Height: 5\' 9"  (1.753 m)   Body mass index is 20.67 kg/m. No exam data present  General: well developed, well nourished, pleasant middle-aged African-American male, seated, in no evident distress Head: head normocephalic and atraumatic.   Neck: supple with no carotid or supraclavicular bruits Cardiovascular: regular rate and rhythm, no murmurs Musculoskeletal: no deformity Skin:  no rash/petichiae Vascular:  Normal pulses all extremities  Neurologic Exam Mental Status: Awake and fully alert. Oriented to place and time. Recent and remote  memory intact. Attention span, concentration and fund of knowledge appropriate. Mood and affect appropriate.  Cranial Nerves: Fundoscopic exam reveals sharp disc margins. Pupils equal, briskly reactive to light. Extraocular movements full without nystagmus. Visual fields show  left homonymous hemianopia. Hearing intact. Facial sensation intact. Face, tongue, palate moves normally and symmetrically.  Motor: Normal bulk and tone.  LUE: 4-/5; LLE: 3+/5 with weak ankle dorsiflexion; full strength right upper and lower extremity Sensory.:  Decreased sensation on left upper and lower extremity compared to right upper and lower extremity Coordination: Rapid alternating movements normal in all extremities. Finger-to-nose and heel-to-shin performed accurately bilaterally. Gait and Station: Arises from chair with mild difficulty. Stance is normal. Gait demonstrates hemiplegic gait. Able to heel, toe and tandem walk moderate difficulty.  Reflexes: 1+ and symmetric. Toes downgoing.    NIHSS  3 Modified Rankin  3    Diagnostic Data (Labs, Imaging, Testing)  CT HEAD WO CONTRAST 09/25/2017 IMPRESSION: 1. Possible early loss of gray-white differentiation in the insular region on the right. Question hyperdense right MCA 2. ASPECTS is 9.  CT ANGIO HEAD W OR WO CONTRAST CT ANGIO NECK W OR WO CONTRAST 09/25/2017 IMPRESSION: Acute embolic occlusion of the supraclinoid ICA and middle cerebral artery on the right. Soft plaque/thrombus in the ICA bulb on the right with stenosis of 30%. No other vascular pathology. The patient does not demonstrate widespread atherosclerotic disease.  IR PERCUTANEOUS ART THROMBECTOMY 09/25/2017 IMPRESSION: Status post endovascular complete revascularization of the right middle cerebral artery, the right internal carotid artery terminus, and the right anterior cerebral artery, with 3 passes with the Embotrap 5 mm x 33 mm retrieval device, and 1 pass with the 3 mm x 20 mm  Trevo ProVue retrieval device as described above.  The patient's right internal carotid artery bulb remained patent with no change in the triangular-shaped smooth defect probably representing an atherosclerotic plaque at the end of the procedure.  MR BRAIN WO CONTRAST MR MRA HEAD  09/26/2017 IMPRESSION: 1. Early subacute infarction involving right basal ganglia, right insula, and right frontal operculum. Additional small scattered infarcts in  the right frontal, parietal, and temporal lobes. Petechial hemorrhage within the right caudate nucleus. 2. Patent anterior and posterior intracranial circulation. No new vessel occlusion/reocclusion. Mild lumen irregularity and mild proximal stenosis of the right M1 segment.    ECHOCARDIOGRAM 09/25/2017 Impressions:  - Normal LV size with mild LV hypertrophy. EF 60-65%. Normal RV   size and systolic function. No significant valvular   abnormalities.   ASSESSMENT: Frank Callahan is a 52 y.o. 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 Vascular risk factors include HLD.  Patient returns today for hospital follow-up and was recovering well in regards to left hemiparesis but approximately 2 weeks ago, he woke up from a nap with left hemiplegia and worsening vision but was not seen in the ED for this.  Upon exam, left hemiparesis noted along with left hemianopia.    PLAN: -Continue aspirin 325 mg daily and clopidogrel 75 mg daily  and Lipitor 20 mg for secondary stroke prevention -Patient was sent to [facility] imaging for CT angio head/neck to rule out new or worsening infarct along with any possible worsening occlusion/dissection -Patient has not followed up with Dr. Corliss Skains as recommended at 4 weeks post discharge.  Will send this note over to them in order for patient to be scheduled with  our office for outpatient follow-up -Started Cymbalta 60 mg for nerve pain as he has intolerance to Neurontin -F/u with PCP regarding your HLD and HTN management -Referral placed for PT/OT/ST near his hometown in [place], [place] -continue to monitor BP at home -advised to continue to stay active and maintain a healthy diet -Maintain strict control of hypertension with blood pressure goal below 130/90, diabetes with hemoglobin A1c goal below 6.5% and cholesterol with LDL cholesterol (bad cholesterol) goal below 70 mg/dL. I also advised the patient to eat a healthy diet with plenty of whole grains, cereals, fruits and vegetables, exercise regularly and maintain ideal body weight.  Follow up in 2 months or call earlier if needed   Greater than 50% of time during this 25 minute visit was spent on counseling,explanation of diagnosis of right MCA multifocal and punctate ACA infarcts, reviewing risk factor management of HLD and HTN, planning of further management, discussion with patient and family and coordination of care    George Hugh, AGNP-BC  [facility] [address] [place], [place] [address]  Phone [phone] Fax [phone] Note: This document was prepared with digital dictation and possible smart phrase technology. Any transcriptional errors that result from this process are unintentional.

## 2017-10-31 NOTE — Telephone Encounter (Signed)
[facility] Auth: NPR Ref # Gunnar Fusi R on 10/31/17 pt is going to walk in at GI.

## 2017-11-01 NOTE — Progress Notes (Signed)
Okay perfect. He has an appointment with me on 01/06/18 at 3:45. Thank you!

## 2017-11-04 NOTE — Progress Notes (Signed)
I agree with the above plan 

## 2017-11-05 ENCOUNTER — Telehealth: Payer: Self-pay

## 2017-11-05 ENCOUNTER — Telehealth: Payer: Self-pay | Admitting: Adult Health

## 2017-11-05 NOTE — Telephone Encounter (Signed)
Rn call patient that the CTA neck did not show any worsening of new stroke or old stroke or narrowing.Rn stated please continue treatment plan with continuation of aspirin and plavix until he is seen in 2 months at his next appt. Continue to do PT, OT and St. PT verbalized understanding. ------

## 2017-11-05 NOTE — Telephone Encounter (Signed)
Mercer Pod Is call ing to make use aware that Per Dr. Megan Mans Family and PCP have agreed to [facility] its easier on patient and family. [phone] .

## 2017-11-05 NOTE — Telephone Encounter (Signed)
-----   Message from Frank Hugh, NP sent at 10/31/2017  4:40 PM EDT ----- Please notify patient that his CTA did not show worsening or new stroke or any worsening narrowing. Please advise him to continue current treatment plan along with continuation of aspirin and plavix until his follow up visit with me in 2 months. He will be called to schedule PT/OT/ST. Thank you

## 2017-11-12 ENCOUNTER — Other Ambulatory Visit (HOSPITAL_COMMUNITY): Payer: Self-pay | Admitting: Interventional Radiology

## 2017-11-12 DIAGNOSIS — I639 Cerebral infarction, unspecified: Secondary | ICD-10-CM

## 2017-12-23 ENCOUNTER — Other Ambulatory Visit: Payer: Self-pay

## 2017-12-23 NOTE — Patient Outreach (Signed)
Telephone outreach to patient to obtain mRs was successfully completed. mRs= 3. 

## 2018-01-06 ENCOUNTER — Ambulatory Visit (HOSPITAL_COMMUNITY): Admission: RE | Admit: 2018-01-06 | Payer: Commercial Managed Care - PPO | Source: Ambulatory Visit

## 2018-01-06 ENCOUNTER — Ambulatory Visit: Payer: Commercial Managed Care - PPO | Admitting: Adult Health

## 2018-01-06 ENCOUNTER — Telehealth: Payer: Self-pay

## 2018-01-06 NOTE — Progress Notes (Deleted)
Chief Complaint: Patient was seen in consultation today for right MCA, right ACA, and right ICA supraclinoid segment occlusions s/p revascularization.  Referring Physician(s): CODE STROKE- Milon DikesArora, Ashish  Supervising Physician: Julieanne Cottoneveshwar, Sanjeev  [facility]  History of Present Illness: Frank ReichmannRicky Callahan is a 52 y.o. 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 [facility] and has been followed by Dr. Corliss Skainseveshwar since 09/2017. He first presented to our department as a code stroke with symptoms of right-sided gaze and left-sided weakness. He was found to have an acute CVA secondary to right ICA dissection and was admitted for management. He underwent an image-guided diagnostic cerebral angiogram with emergent mechanical thrombectomy of his right MCA, right ACA, and right ICA supraclinoid segment occlusions achieving a TICI 3 reperfusion 09/25/2017 by Dr. Corliss Skainseveshwar. He was discharged home 09/27/2017 in stable condition.  Patient presents today for stroke follow-up and to discuss his recent procedure 09/25/2017. Patient awake and alert *** Accompanied by *** Complains of *** Denies ***  Patient is currently taking Plavix 75 mg once daily and Aspirin 325 mg once daily.   Past Medical History:  Diagnosis Date  . Medical history non-contributory   . Stroke [facility](HCC)     Past Surgical History:  Procedure Laterality Date  . IR CT HEAD LTD  09/25/2017  . IR PERCUTANEOUS ART THROMBECTOMY/INFUSION INTRACRANIAL INC DIAG ANGIO  09/25/2017  . RADIOLOGY WITH ANESTHESIA N/A 09/25/2017   Procedure: IR WITH ANESTHESIA;  Surgeon: Radiologist, Medication, MD;  Location: [facility] OR;  Service: Radiology;  Laterality: N/A;    Allergies: Iodinated diagnostic agents; Neurontin [gabapentin]; Amoxicillin; and Clarithromycin  Medications: Prior to Admission medications   Medication Sig Start Date End Date Taking? Authorizing Provider    acetaminophen (TYLENOL) 500 MG tablet Take 500 mg by mouth every 6 (six) hours as needed for moderate pain.    [provider]  amLODipine (NORVASC) 5 MG tablet TK 1 T PO QD FOR BP 10/26/17   [provider]  aspirin EC 325 MG EC tablet Take 1 tablet (325 mg total) by mouth daily. 09/28/17   Layne BentonBiby, Sharon L, NP  atorvastatin (LIPITOR) 20 MG tablet Take 1 tablet (20 mg total) by mouth daily at 6 PM. 09/27/17   Layne BentonBiby, Sharon L, NP  clopidogrel (PLAVIX) 75 MG tablet Take 1 tablet (75 mg total) by mouth daily. 09/28/17   Layne BentonBiby, Sharon L, NP  DULoxetine (CYMBALTA) 60 MG capsule Take 1 capsule (60 mg total) by mouth daily. 10/31/17   George HughVanschaick, Jessica, NP     No family history on file.  Social History   Socioeconomic History  . Marital status: Unknown    Spouse name: Not on file  . Number of children: Not on file  . Years of education: Not on file  . Highest education level: Not on file  Occupational History  . Not on file  Social Needs  . Financial resource strain: Not on file  . Food insecurity:    Worry: Not on file    Inability: Not on file  . Transportation needs:    Medical: Not on file    Non-medical: Not on file  Tobacco Use  . Smoking status: Never Smoker  . Smokeless tobacco: Never Used  Substance and Sexual Activity  . Alcohol use: Not Currently  . Drug use: Not Currently  . Sexual activity: Not on file  Lifestyle  . Physical activity:    Days  per week: Not on file    Minutes per session: Not on file  . Stress: Not on file  Relationships  . Social connections:    Talks on phone: Not on file    Gets together: Not on file    Attends religious service: Not on file    Active member of club or organization: Not on file    Attends meetings of clubs or organizations: Not on file    Relationship status: Not on file  Other Topics Concern  . Not on file  Social History Narrative  . Not on file     Review of Systems: A 12 point ROS discussed and  pertinent positives are indicated in the HPI above.  All other systems are negative.  Review of Systems  Vital Signs: There were no vitals taken for this visit.  Physical Exam   Imaging: No results found.  Labs:  CBC: Recent Labs    09/25/17 0825 09/25/17 0828 09/26/17 0527 09/27/17 0611  WBC 4.6  --  7.5 5.7  HGB 14.0 15.0 12.0* 12.1*  HCT 44.7 44.0 37.6* 39.1  PLT 193  --  160 157    COAGS: Recent Labs    09/25/17 0825  INR 0.97  APTT 25    BMP: Recent Labs    09/25/17 0825 09/25/17 0828 09/26/17 0527 09/27/17 0611  NA 140 140 141 142  K 4.4 4.0 3.7 3.7  CL 104 105 110 108  CO2 27  --  25 26  GLUCOSE 109* 106* 111* 106*  BUN 16 23* 8 11  CALCIUM 9.3  --  8.3* 8.7*  CREATININE 1.28* 1.30* 1.23 1.15  GFRNONAA >60  --  >60 >60  GFRAA >60  --  >60 >60    LIVER FUNCTION TESTS: Recent Labs    09/25/17 0825  BILITOT 0.8  AST 29  ALT 22  ALKPHOS 42  PROT 6.5  ALBUMIN 3.5    TUMOR MARKERS: No results for input(s): AFPTM, CEA, CA199, CHROMGRNA in the last 8760 hours.  Assessment and Plan:  Right MCA, right ACA, and right ICA supraclinoid segment occlusions s/p emergent mechanical thrombectomy achieving a TICI 3 revascularization 09/25/2017 by Dr. Corliss Skainseveshwar. Reviewed imaging with patient. ***  Plan for follow-up *** *** Instructed patient to continue taking Plavix 75 mg once daily and Aspirin 325 mg once daily.  All questions answered and concerns addressed. Patient conveys understanding and agrees with plan.  Thank you for this interesting consult.  I greatly enjoyed meeting Frank ReichmannRicky Bachtel and look forward to participating in their care.  A copy of this report was sent to the requesting provider on this date.  Electronically Signed: Elwin MochaAlexandra Louk, PA-C 01/06/2018, 9:32 AM   I spent a total of {Established Out-Pt:[id number]} in face to face in clinical consultation, greater than 50% of which was counseling/coordinating care for right  MCA, right ACA, and right ICA supraclinoid segment occlusions s/p revascularization.

## 2018-01-06 NOTE — Telephone Encounter (Signed)
Patient no show for appt today. 

## 2018-01-09 ENCOUNTER — Encounter: Payer: Self-pay | Admitting: Adult Health

## 2018-01-22 ENCOUNTER — Encounter (HOSPITAL_COMMUNITY): Payer: Self-pay

## 2018-01-22 ENCOUNTER — Inpatient Hospital Stay (HOSPITAL_COMMUNITY): Admission: RE | Admit: 2018-01-22 | Payer: Commercial Managed Care - PPO | Source: Ambulatory Visit

## 2018-01-22 HISTORY — DX: Personal history of other diseases of the circulatory system: Z86.79

## 2018-01-22 HISTORY — DX: Hyperlipidemia, unspecified: E78.5

## 2018-02-05 ENCOUNTER — Telehealth (HOSPITAL_COMMUNITY): Payer: Self-pay

## 2018-02-05 NOTE — Telephone Encounter (Signed)
Called to reschedule consult, no answer, no vm. AW  

## 2018-02-20 ENCOUNTER — Ambulatory Visit (HOSPITAL_COMMUNITY): Payer: Commercial Managed Care - PPO

## 2018-02-27 ENCOUNTER — Ambulatory Visit (HOSPITAL_COMMUNITY): Payer: Commercial Managed Care - PPO | Attending: Interventional Radiology

## 2018-02-27 ENCOUNTER — Ambulatory Visit: Payer: Commercial Managed Care - PPO | Admitting: Adult Health

## 2018-05-08 DIAGNOSIS — Z0271 Encounter for disability determination: Secondary | ICD-10-CM

## 2018-11-17 ENCOUNTER — Other Ambulatory Visit: Payer: Self-pay | Admitting: Adult Health

## 2018-11-17 DIAGNOSIS — I63411 Cerebral infarction due to embolism of right middle cerebral artery: Secondary | ICD-10-CM

## 2018-12-30 IMAGING — CT CT ANGIO NECK
1 of 5 series · 3 of 16 positions shown · IV contrast (APPLIED)
Comparison: Multiple prior MRI, CTA, and catheter angiogram
examinations.

CLINICAL DATA: LEFT-sided weakness for 2 weeks.

EXAM:
CT ANGIOGRAPHY HEAD AND NECK
TECHNIQUE: Multidetector CT imaging of the head and neck was performed using
the standard protocol during bolus administration of intravenous
contrast. Multiplanar CT image reconstructions and MIPs were
obtained to evaluate the vascular anatomy. Carotid stenosis
measurements (when applicable) are obtained utilizing NASCET
criteria, using the distal internal carotid diameter as the
denominator.
CONTRAST:  75mL [facility]-3JS IOPAMIDOL ([facility]-3JS) INJECTION 76%

[Series 7: head/neck angio · axial · 0.46mm/px · z∈[-403,-17]mm · 3 of 194 slices shown]
[im 1/194  soft-tissue]
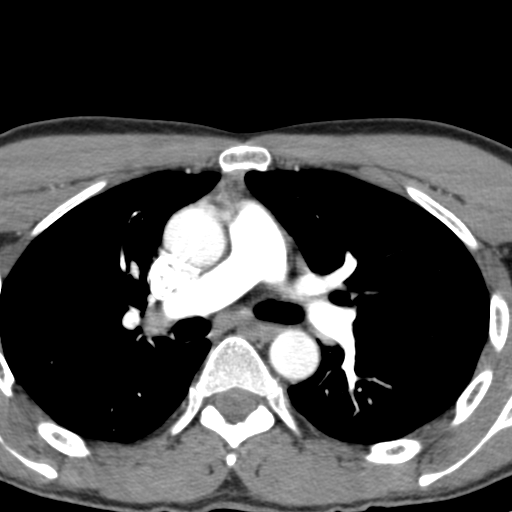
[im 97/194  bone]
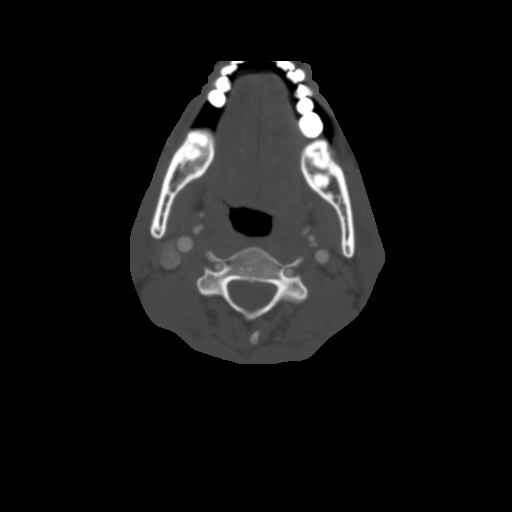
[im 194/194  soft-tissue]
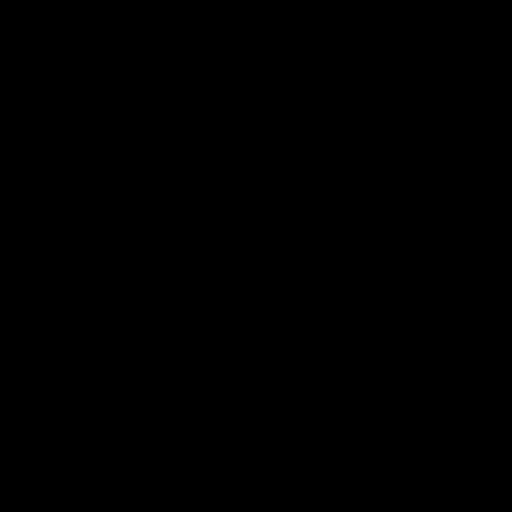

[3 of 16 positions shown; findings below may reference images not displayed]

FINDINGS: CT HEAD FINDINGS

Brain: Subacute bland infarction, RIGHT MCA territory, involving
primarily the basal ganglia and insula. Some periventricular white
matter involvement is also noted. Otherwise, mild atrophy, slightly
premature for age.

Vascular: Reported separately.

Skull: Intact.

Sinuses: Clear.

Orbits: No acute finding.

Review of the MIP images confirms the above findings

CTA NECK FINDINGS

Aortic arch: Standard branching. Imaged portion shows no evidence of
aneurysm or dissection. No significant stenosis of the major arch
vessel origins.

Right carotid system: No evidence of dissection, stenosis (50% or
greater) or occlusion. Interval partial regression of the soft
plaque at the RIGHT ICA bulb. No measurable stenosis.

Left carotid system: No evidence of dissection, stenosis, or
occlusion.

Vertebral arteries: Codominant. No evidence of dissection, stenosis
(50% or greater) or occlusion.

Skeleton: Negative

Other neck: Negative

Upper chest: Negative

Review of the MIP images confirms the above findings

CTA HEAD FINDINGS

Anterior circulation: Wide patency of the previously occluded RIGHT
sided anterior circulation, including ICA occlusion. No residual
stenosis or thrombus. Normal LEFT-sided anterior circulation.

Posterior circulation: Widely patent. Both vertebrals contribute to
basilar formation, LEFT slightly larger. No cerebellar branch
occlusion, stenosis, or dissection.

Venous sinuses: As permitted by contrast timing, patent.

Anatomic variants: None of significance.

Delayed phase: Minor areas of postcontrast enhancement consistent
with the subacute infarct time course.

Review of the MIP images confirms the above findings
IMPRESSION: Interval partial regression of the previously identified fibrofatty
plaque at the RIGHT ICA origin/bulb. No measurable stenosis on
today's exam. No evidence of dissection.

Wide patency of the intracranial circulation, with specific
attention to the RIGHT ICA/ACA/MCA segments.

Expected evolutionary appearance of the MCA territory infarct,
primarily involving the basal ganglia. No reperfusion hemorrhage is
evident.

## 2019-01-28 ENCOUNTER — Other Ambulatory Visit: Payer: Self-pay | Admitting: Adult Health

## 2019-01-28 DIAGNOSIS — I63411 Cerebral infarction due to embolism of right middle cerebral artery: Secondary | ICD-10-CM
